# Patient Record
Sex: Male | Born: 1985 | Hispanic: No | Marital: Married | Smoking: Never smoker
Health system: Southern US, Community
[De-identification: ages and names within clinical notes are randomized; demographics above are authoritative.]

## PROBLEM LIST (undated history)

## (undated) DIAGNOSIS — Z86711 Personal history of pulmonary embolism: Secondary | ICD-10-CM

## (undated) DIAGNOSIS — K219 Gastro-esophageal reflux disease without esophagitis: Secondary | ICD-10-CM

## (undated) HISTORY — DX: Personal history of pulmonary embolism: Z86.711

## (undated) HISTORY — DX: Gastro-esophageal reflux disease without esophagitis: K21.9

## (undated) HISTORY — PX: APPENDECTOMY: SHX54

---

## 2018-12-30 ENCOUNTER — Ambulatory Visit (INDEPENDENT_AMBULATORY_CARE_PROVIDER_SITE_OTHER): Payer: 59 | Admitting: Family Medicine

## 2018-12-30 ENCOUNTER — Encounter: Payer: Self-pay | Admitting: Family Medicine

## 2018-12-30 ENCOUNTER — Other Ambulatory Visit: Payer: Self-pay

## 2018-12-30 VITALS — Ht 64.0 in | Wt 155.0 lb

## 2018-12-30 DIAGNOSIS — Z0000 Encounter for general adult medical examination without abnormal findings: Secondary | ICD-10-CM

## 2018-12-30 DIAGNOSIS — K219 Gastro-esophageal reflux disease without esophagitis: Secondary | ICD-10-CM | POA: Insufficient documentation

## 2018-12-30 MED ORDER — PANTOPRAZOLE SODIUM 40 MG PO TBEC: 40.0000 mg | DELAYED_RELEASE_TABLET | Freq: Every day | ORAL | 3 refills | Status: DC

## 2018-12-30 MED FILL — PANTOPRAZOLE SOD DR 40 MG T: 40 | 30 days supply | Qty: 30 | Fill #0

## 2018-12-30 NOTE — Progress Notes (Signed)
Chief Complaint  Patient presents with  . New Patient (Initial Visit)    unable to check bp, pulse       New Patient Visit SUBJECTIVE: HPI: Reginald Dodson is an 33 y.o.male who is being seen for establishing care. Due to COVID-19 pandemic, we are interacting via web portal for an electronic face-to-face visit. I verified patient's ID using 2 identifiers. Patient agreed to proceed with visit via this method. Patient is at home, I am at home. Patient and I are present for visit.   Hx of intermittent GERD. Takes OTC omeprazole prn, usually works well. Has had for nearly 20 years. Last year's labs showed microcytosis w Nml Hb. He would be interested in EGD if low again this year.   No Known Allergies  Past Medical History:  Diagnosis Date  . GERD (gastroesophageal reflux disease)   . History of pulmonary embolus (PE)    Thought to be related to travel   Past Surgical History:  Procedure Laterality Date  . APPENDECTOMY     Family History  Problem Relation Age of Onset  . Cancer Neg Hx   . Clotting disorder Neg Hx    No Known Allergies  Current Outpatient Medications:  .  pantoprazole (PROTONIX) 40 MG tablet, Take 1 tablet (40 mg total) by mouth daily., Disp: 30 tablet, Rfl: 3  ROS GI: Denies current dyspepsia   OBJECTIVE: Ht 5\' 4"  (1.626 m)   Wt 155 lb (70.3 kg)   BMI 26.61 kg/m  No conversational dyspnea Age appropriate judgment and insight Nml affect and mood  ASSESSMENT/PLAN: Well adult exam - Plan: Hemoglobin A1c, CBC, Comprehensive metabolic panel, Lipid panel, TSH, Vitamin D (25 hydroxy), will see in office in 2 d for CPE.   Gastroesophageal reflux disease, esophagitis presence not specified - Plan: PPI prn. Offered referral to GI at any time.   The patient voiced understanding and agreement to the plan.   [place], DO 12/30/18  2:10 PM

## 2018-12-31 ENCOUNTER — Other Ambulatory Visit: Payer: Self-pay

## 2018-12-31 ENCOUNTER — Other Ambulatory Visit (INDEPENDENT_AMBULATORY_CARE_PROVIDER_SITE_OTHER): Payer: 59

## 2018-12-31 DIAGNOSIS — Z0000 Encounter for general adult medical examination without abnormal findings: Secondary | ICD-10-CM | POA: Diagnosis not present

## 2018-12-31 LAB — CBC
HCT: 42.3 % (ref 39.0–52.0)
Hemoglobin: 14.1 g/dL (ref 13.0–17.0)
MCHC: 33.4 g/dL (ref 30.0–36.0)
MCV: 79.5 fl (ref 78.0–100.0)
Platelets: 227 10*3/uL (ref 150.0–400.0)
RBC: 5.32 Mil/uL (ref 4.22–5.81)
RDW: 13.4 % (ref 11.5–15.5)
WBC: 5.9 10*3/uL (ref 4.0–10.5)

## 2018-12-31 LAB — COMPREHENSIVE METABOLIC PANEL
ALT: 37 U/L (ref 0–53)
AST: 24 U/L (ref 0–37)
Albumin: 4.1 g/dL (ref 3.5–5.2)
Alkaline Phosphatase: 45 U/L (ref 39–117)
BUN: 14 mg/dL (ref 6–23)
CO2: 29 mEq/L (ref 19–32)
Calcium: 9.4 mg/dL (ref 8.4–10.5)
Chloride: 102 mEq/L (ref 96–112)
Creatinine, Ser: 0.97 mg/dL (ref 0.40–1.50)
GFR: 88.11 mL/min (ref >60.00)
Glucose, Bld: 87 mg/dL (ref 70–99)
Potassium: 4.3 mEq/L (ref 3.5–5.1)
Sodium: 137 mEq/L (ref 135–145)
Total Bilirubin: 0.6 mg/dL (ref 0.2–1.2)
Total Protein: 6.5 g/dL (ref 6.0–8.3)

## 2018-12-31 LAB — LIPID PANEL
Cholesterol: 138 mg/dL (ref 0–200)
HDL: 28.2 mg/dL — ABNORMAL LOW (ref >39.00)
LDL Cholesterol: 87 mg/dL (ref 0–99)
NonHDL: 109.69
Total CHOL/HDL Ratio: 5
Triglycerides: 115 mg/dL (ref 0.0–149.0)
VLDL: 23 mg/dL (ref 0.0–40.0)

## 2018-12-31 LAB — HEMOGLOBIN A1C: Hgb A1c MFr Bld: 5.8 % (ref 4.6–6.5)

## 2018-12-31 LAB — TSH: TSH: 4.74 u[IU]/mL — ABNORMAL HIGH (ref 0.35–4.50)

## 2018-12-31 LAB — VITAMIN D 25 HYDROXY (VIT D DEFICIENCY, FRACTURES): VITD: 22.41 ng/mL — ABNORMAL LOW (ref 30.00–100.00)

## 2019-01-01 ENCOUNTER — Other Ambulatory Visit (INDEPENDENT_AMBULATORY_CARE_PROVIDER_SITE_OTHER): Payer: 59

## 2019-01-01 DIAGNOSIS — R946 Abnormal results of thyroid function studies: Secondary | ICD-10-CM | POA: Diagnosis not present

## 2019-01-01 LAB — T4, FREE: Free T4: 0.84 ng/dL (ref 0.60–1.60)

## 2019-01-15 ENCOUNTER — Telehealth: Payer: Self-pay | Admitting: Family Medicine

## 2019-01-15 NOTE — Telephone Encounter (Signed)
Called pt to make cpe appt. Left msg

## 2019-02-10 ENCOUNTER — Encounter: Payer: Self-pay | Admitting: Family Medicine

## 2019-02-10 ENCOUNTER — Ambulatory Visit (INDEPENDENT_AMBULATORY_CARE_PROVIDER_SITE_OTHER): Payer: 59 | Admitting: Family Medicine

## 2019-02-10 ENCOUNTER — Other Ambulatory Visit: Payer: Self-pay

## 2019-02-10 VITALS — BP 108/78 | HR 102 | Temp 98.5°F | Ht 64.0 in | Wt 160.2 lb

## 2019-02-10 DIAGNOSIS — Z23 Encounter for immunization: Secondary | ICD-10-CM

## 2019-02-10 DIAGNOSIS — Z0000 Encounter for general adult medical examination without abnormal findings: Secondary | ICD-10-CM | POA: Diagnosis not present

## 2019-02-10 DIAGNOSIS — Z114 Encounter for screening for human immunodeficiency virus [HIV]: Secondary | ICD-10-CM | POA: Diagnosis not present

## 2019-02-10 NOTE — Progress Notes (Signed)
Chief Complaint  Patient presents with  . Annual Exam    Well Male Reginald Dodson is here for a complete physical.   His last physical was >1 year ago.  Current diet: in general, a "healthy" diet.   Current exercise: active in yard, yoga, pushups Weight trend: stable Daytime fatigue? No. Seat belt? Yes.    Health maintenance Tetanus- No HIV- No  Past Medical History:  Diagnosis Date  . GERD (gastroesophageal reflux disease)   . History of pulmonary embolus (PE)    Thought to be related to travel     Past Surgical History:  Procedure Laterality Date  . APPENDECTOMY      Medications  Current Outpatient Medications on File Prior to Visit  Medication Sig Dispense Refill  . cholecalciferol (VITAMIN D3) 25 MCG (1000 UT) tablet Take 1,000 Units by mouth daily.    . Omega-3 Fatty Acids (FISH OIL) 1000 MG CAPS Take by mouth.    . pantoprazole (PROTONIX) 40 MG tablet Take 1 tablet (40 mg total) by mouth daily. 30 tablet 3   Allergies No Known Allergies  Family History Family History  Problem Relation Age of Onset  . Cancer Neg Hx   . Clotting disorder Neg Hx     Review of Systems: Constitutional: no fevers or chills Eye:  no recent significant change in vision Ear/Nose/Mouth/Throat:  Ears:  no tinnitus or hearing loss Nose/Mouth/Throat:  no complaints of nasal congestion, no sore throat Cardiovascular:  no chest pain, no palpitations Respiratory:  no cough and no shortness of breath Gastrointestinal:  no abdominal pain, no change in bowel habits GU:  Male: negative for dysuria, frequency, and incontinence and negative for prostate symptoms Musculoskeletal/Extremities:  no pain, redness, or swelling of the joints Integumentary (Skin/Breast):  no abnormal skin lesions reported Neurologic:  no headaches, no numbness, tingling Endocrine: No unexpected weight changes Hematologic/Lymphatic:  no night sweats  Exam BP 108/78 (BP Location: Left Arm, Patient Position:  Sitting, Cuff Size: Normal)   Pulse (!) 102   Temp 98.5 F (36.9 C) (Oral)   Ht 5\' 4"  (1.626 m)   Wt 160 lb 4 oz (72.7 kg)   SpO2 98%   BMI 27.51 kg/m  General:  well developed, well nourished, in no apparent distress Skin:  no significant moles, warts, or growths Head:  no masses, lesions, or tenderness Eyes:  pupils equal and round, sclera anicteric without injection Ears:  canals without lesions, TMs shiny without retraction, no obvious effusion, no erythema Nose:  nares patent, septum midline, mucosa normal Throat/Pharynx:  lips and gingiva without lesion; tongue and uvula midline; non-inflamed pharynx; no exudates or postnasal drainage Neck: neck supple without adenopathy, thyromegaly, or masses Lungs:  clear to auscultation, breath sounds equal bilaterally, no respiratory distress Cardio:  regular rate and rhythm, no bruits, no LE edema Abdomen:  abdomen soft, nontender; bowel sounds normal; no masses or organomegaly Genital (male): circumcised penis, no lesions or discharge; testes present bilaterally without masses or tenderness Rectal: Deferred Musculoskeletal:  symmetrical muscle groups noted without atrophy or deformity Extremities:  no clubbing, cyanosis, or edema, no deformities, no skin discoloration Neuro:  gait normal; deep tendon reflexes normal and symmetric Psych: well oriented with normal range of affect and appropriate judgment/insight  Assessment and Plan  No diagnosis found.   Well 33 y.o. male. Counseled on diet and exercise. Other orders as above. Follow up in 1 year pending the above workup. The patient voiced understanding and agreement to the plan.  Jilda Rocheicholas Paul AlfordWendling, DO 02/10/19 1:16 PM

## 2019-02-10 NOTE — Addendum Note (Signed)
Addended by: Sharon Seller B on: 02/10/2019 01:28 PM   Modules accepted: Orders

## 2019-02-10 NOTE — Patient Instructions (Addendum)
Keep the diet clean and stay active.  Do monthly self testicular checks in the shower. You are feeling for lumps/bumps that don't belong. If you feel anything like this, let me know!  Let us know if you need anything. 

## 2019-07-16 ENCOUNTER — Emergency Department (HOSPITAL_BASED_OUTPATIENT_CLINIC_OR_DEPARTMENT_OTHER)
Admission: EM | Admit: 2019-07-16 | Discharge: 2019-07-16 | Disposition: A | Discharge status: Home / Self Care | Payer: 59 | Attending: Emergency Medicine | Admitting: Emergency Medicine

## 2019-07-16 ENCOUNTER — Encounter (HOSPITAL_BASED_OUTPATIENT_CLINIC_OR_DEPARTMENT_OTHER): Payer: Self-pay | Admitting: *Deleted

## 2019-07-16 ENCOUNTER — Emergency Department (HOSPITAL_BASED_OUTPATIENT_CLINIC_OR_DEPARTMENT_OTHER): Payer: 59

## 2019-07-16 ENCOUNTER — Other Ambulatory Visit: Payer: Self-pay

## 2019-07-16 DIAGNOSIS — Z86711 Personal history of pulmonary embolism: Secondary | ICD-10-CM | POA: Diagnosis not present

## 2019-07-16 DIAGNOSIS — R0781 Pleurodynia: Secondary | ICD-10-CM | POA: Diagnosis not present

## 2019-07-16 DIAGNOSIS — R0602 Shortness of breath: Secondary | ICD-10-CM | POA: Diagnosis not present

## 2019-07-16 DIAGNOSIS — R079 Chest pain, unspecified: Secondary | ICD-10-CM | POA: Insufficient documentation

## 2019-07-16 LAB — COMPREHENSIVE METABOLIC PANEL
ALT: 35 U/L (ref 0–44)
AST: 26 U/L (ref 15–41)
Albumin: 4.1 g/dL (ref 3.5–5.0)
Alkaline Phosphatase: 48 U/L (ref 38–126)
Anion gap: 8 (ref 5–15)
BUN: 17 mg/dL (ref 6–20)
CO2: 23 mmol/L (ref 22–32)
Calcium: 9.4 mg/dL (ref 8.9–10.3)
Chloride: 105 mmol/L (ref 98–111)
Creatinine, Ser: 0.87 mg/dL (ref 0.61–1.24)
GFR calc Af Amer: >60 mL/min (ref >60)
GFR calc non Af Amer: >60 mL/min (ref >60)
Glucose, Bld: 137 mg/dL — ABNORMAL HIGH (ref 70–99)
Potassium: 3.6 mmol/L (ref 3.5–5.1)
Sodium: 136 mmol/L (ref 135–145)
Total Bilirubin: 0.9 mg/dL (ref 0.3–1.2)
Total Protein: 7.3 g/dL (ref 6.5–8.1)

## 2019-07-16 LAB — CBC WITH DIFFERENTIAL/PLATELET
Abs Immature Granulocytes: 0.02 10*3/uL (ref 0.00–0.07)
Basophils Absolute: 0 10*3/uL (ref 0.0–0.1)
Basophils Relative: 1 %
Eosinophils Absolute: 0.1 10*3/uL (ref 0.0–0.5)
Eosinophils Relative: 1 %
HCT: 43 % (ref 39.0–52.0)
Hemoglobin: 14.5 g/dL (ref 13.0–17.0)
Immature Granulocytes: 0 %
Lymphocytes Relative: 24 %
Lymphs Abs: 1.9 10*3/uL (ref 0.7–4.0)
MCH: 26.9 pg (ref 26.0–34.0)
MCHC: 33.7 g/dL (ref 30.0–36.0)
MCV: 79.8 fL — ABNORMAL LOW (ref 80.0–100.0)
Monocytes Absolute: 0.7 10*3/uL (ref 0.1–1.0)
Monocytes Relative: 9 %
Neutro Abs: 5 10*3/uL (ref 1.7–7.7)
Neutrophils Relative %: 65 %
Platelets: 225 10*3/uL (ref 150–400)
RBC: 5.39 MIL/uL (ref 4.22–5.81)
RDW: 12.7 % (ref 11.5–15.5)
WBC: 7.7 10*3/uL (ref 4.0–10.5)
nRBC: 0 % (ref 0.0–0.2)

## 2019-07-16 LAB — LIPASE, BLOOD: Lipase: 38 U/L (ref 11–51)

## 2019-07-16 MED ORDER — IOHEXOL 350 MG/ML SOLN
100.0000 mL | Freq: Once | INTRAVENOUS | Status: AC | PRN
  Administered 2019-07-16: 100 mL via INTRAVENOUS

## 2019-07-16 NOTE — ED Provider Notes (Signed)
[facility] EMER[facility]ENCY DEPARTMENT Provider Note   CSN: [id number] Arrival date & time: 07/16/19  1054     History Chief Complaint  Patient presents with  . Chest Pain    Reginald Dodson is a 33 y.o. male with a history of right-sided pulmonary embolism in 2016, previously on A/C but no longer, who presents emergency department the right-sided chest pain.  He reports 2 days of symptoms.  He says he feels some discomfort in his right lower rib line.  It is associated with some pleuritic discomfort.  The pain has been persistent.  He feels like his heart rate is also higher than normal.  He is concerned he may have another pulmonary embolism   He denies any history of biliary colic.  He reports nausea, but denies vomiting or other GI symptoms.  He denies any fevers or chills.  He denies any URI symptoms.  He did get the Covid vaccine 3 days ago.  He denies any coronary history.  No history of angina.  Patient works as a Psychologist, educational at [employer].  NKDA  HPI     Past Medical History:  Diagnosis Date  . GERD (gastroesophageal reflux disease)   . History of pulmonary embolus (PE)    Thought to be related to travel    Patient Active Problem List   Diagnosis Date Noted  . Gastroesophageal reflux disease 12/30/2018  . GERD (gastroesophageal reflux disease)     Past Surgical History:  Procedure Laterality Date  . APPENDECTOMY         Family History  Problem Relation Age of Onset  . Cancer Neg Hx   . Clotting disorder Neg Hx     Social History   Tobacco Use  . Smoking status: Never Smoker  . Smokeless tobacco: Never Used  Substance Use Topics  . Alcohol use: Not Currently  . Drug use: Never    Home Medications Prior to Admission medications   Medication Sig Start Date End Date Taking? Authorizing Provider  cholecalciferol (VITAMIN D3) 25 MCG (1000 UT) tablet Take 1,000 Units by mouth daily.   Yes [provider]  Omega-3 Fatty Acids (FISH OIL) 1000  MG CAPS Take by mouth.   Yes [provider]  pantoprazole (PROTONIX) 40 MG tablet Take 1 tablet (40 mg total) by mouth daily. 12/30/18  Yes Shelda Pal, DO    Allergies    Patient has no known allergies.  Review of Systems   Review of Systems  Constitutional: Negative for chills and fever.  Respiratory: Positive for shortness of breath. Negative for cough.   Cardiovascular: Positive for chest pain. Negative for leg swelling.  Gastrointestinal: Positive for nausea. Negative for abdominal pain and vomiting.  Neurological: Negative for syncope.  Psychiatric/Behavioral: Negative for agitation and confusion.  All other systems reviewed and are negative.   Physical Exam Updated Vital Signs BP 113/71   Pulse 88   Temp 98.6 F (37 C) (Oral)   Resp (!) 25   SpO2 100%   Physical Exam Vitals and nursing note reviewed.  Constitutional:      Appearance: He is well-developed.  HENT:     Head: Normocephalic and atraumatic.  Eyes:     Conjunctiva/sclera: Conjunctivae normal.  Cardiovascular:     Rate and Rhythm: Normal rate and regular rhythm.     Heart sounds: No murmur.  Pulmonary:     Effort: Pulmonary effort is normal. No respiratory distress.     Breath sounds: Normal breath sounds.  Abdominal:     Palpations: Abdomen is soft.     Tenderness: There is no abdominal tenderness. There is no guarding or rebound. Negative signs include Murphy's sign.  Musculoskeletal:     Cervical back: Neck supple.  Skin:    General: Skin is warm and dry.  Neurological:     Mental Status: He is alert.  Psychiatric:        Mood and Affect: Mood normal.        Behavior: Behavior normal.     ED Results / Procedures / Treatments   Labs (all labs ordered are listed, but only abnormal results are displayed) Labs Reviewed  COMPREHENSIVE METABOLIC PANEL - Abnormal; Notable for the following components:      Result Value   Glucose, Bld 137 (*)    All other components within  normal limits  CBC WITH DIFFERENTIAL/PLATELET - Abnormal; Notable for the following components:   MCV 79.8 (*)    All other components within normal limits  LIPASE, BLOOD    EKG EKG Interpretation  Date/Time:  Wednesday July 16 2019 11:04:48 EST Ventricular Rate:  96 PR Interval:    QRS Duration: 96 QT Interval:  338 QTC Calculation: 428 R Axis:   -99 Text Interpretation: Sinus rhythm LAD, consider left anterior fascicular block ST elevation, consider inferior injury No STEMI Confirmed by Alvester Chou [id number] on 07/16/2019 11:07:30 AM Also confirmed by Alvester Chou [phone]  on 07/16/2019 12:30:55 PM   Radiology CT Angio Chest PE W and/or Wo Contrast  Result Date: 07/16/2019 CLINICAL DATA:  Shortness of breath and chest pain EXAM: CT ANGIOGRAPHY CHEST WITH CONTRAST TECHNIQUE: Multidetector CT imaging of the chest was performed using the standard protocol during bolus administration of intravenous contrast. Multiplanar CT image reconstructions and MIPs were obtained to evaluate the vascular anatomy. CONTRAST:  OMNIPAQUE IOHEXOL 350 MG/ML SOLN COMPARISON:  CT angiogram chest January 01, 2017; noncontrast enhanced chest CT June 22, 2017 FINDINGS: Cardiovascular: There is no demonstrable pulmonary embolus. There is no thoracic aortic aneurysm or dissection. Visualized great vessels appear unremarkable. There is no appreciable pericardial effusion or pericardial thickening. Mediastinum/Nodes: Visualized thyroid appears unremarkable. There is no evident thoracic adenopathy. There is a small hiatal hernia. Lungs/Pleura: There is no appreciable edema or consolidation. No pleural effusions are evident. A previously noted 6 mm nodular opacity on the right in the right middle lobe region along the right minor fissure is stable, currently seen on axial slice 41 series 6. Previously noted 3 mm nodular opacity in the right upper lobe is not appreciable currently. No new pulmonary nodular  lesions are evident. Upper Abdomen: Visualized upper abdominal structures appear unremarkable. Musculoskeletal: No blastic or lytic bone lesions. No evident chest wall lesions. Review of the MIP images confirms the above findings. IMPRESSION: 1. No demonstrable pulmonary embolus. No thoracic aortic aneurysm or dissection. 2. No edema or consolidation. No pleural effusion. Stable 6 mm nodular opacity in the right middle lobe region, a potential perifissural lymph node. No new pulmonary nodular opacities evident. 3.  No evident adenopathy. 4.  Small hiatal hernia. Electronically Signed   By: Bretta Bang III M.D.   On: 07/16/2019 12:23    Procedures Procedures (including critical care time)  Medications Ordered in ED Medications  iohexol (OMNIPAQUE) 350 MG/ML injection 100 mL (100 mLs Intravenous Contrast Given 07/16/19 1213)    ED Course  I have reviewed the triage vital signs and the nursing notes.  Pertinent labs & imaging results that were  available during my care of the patient were reviewed by me and considered in my medical decision making (see chart for details).   33 yo male w/ hx of PE four years ago, no longer on A/C, presenting with pleuritic right sided chest pain for 2 days.  He expresses concerns for recurrent PE.  He is mildly tachycardic here and states that is unusual for him.  It's not clear whether his prior PE was provoked.  He tells me he was flying a lot at the time, but his DVT studies and coagulopathy panels were negative.    He also reports he had a normal DDimer during his prior PE.  Given this fact, I think it's reasonable to proceed directly to CT PE.  The patient is in agreement.  Otherwise he has a benign abdominal exam.  Labs are pending  Low suspicion for PNA, ACS, Aortic dissection, or PTX based on this presentation    This note was dictated using dragon dictation software.  Please be aware that there may be minor translation errors as a result of this  oral dictation   Final Clinical Impression(s) / ED Diagnoses Final diagnoses:  Right-sided chest pain    Rx / DC Orders ED Discharge Orders    None       Varonica Siharath, Kermit BaloMatthew J, MD 07/16/19 1904

## 2019-07-16 NOTE — ED Triage Notes (Signed)
Patient stated that he has a chest pain  (pointing to his RUQ) 2 days ago.

## 2019-08-19 DIAGNOSIS — Z1159 Encounter for screening for other viral diseases: Secondary | ICD-10-CM | POA: Diagnosis not present

## 2019-08-22 DIAGNOSIS — K2930 Chronic superficial gastritis without bleeding: Secondary | ICD-10-CM | POA: Diagnosis not present

## 2019-08-22 DIAGNOSIS — K3189 Other diseases of stomach and duodenum: Secondary | ICD-10-CM | POA: Diagnosis not present

## 2019-08-22 DIAGNOSIS — R12 Heartburn: Secondary | ICD-10-CM | POA: Diagnosis not present

## 2019-08-22 DIAGNOSIS — B9681 Helicobacter pylori [H. pylori] as the cause of diseases classified elsewhere: Secondary | ICD-10-CM | POA: Diagnosis not present

## 2019-08-22 DIAGNOSIS — K449 Diaphragmatic hernia without obstruction or gangrene: Secondary | ICD-10-CM | POA: Diagnosis not present

## 2019-08-22 DIAGNOSIS — K222 Esophageal obstruction: Secondary | ICD-10-CM | POA: Diagnosis not present

## 2019-08-27 MED FILL — AMOXICILLIN 500 MG CAPSULE: 500 | 10 days supply | Qty: 40 | Fill #0

## 2019-08-27 MED FILL — CLARITHROMYCIN 500 MG TAB: 500 | 10 days supply | Qty: 20 | Fill #0

## 2019-08-27 MED FILL — PANTOPRAZOLE SOD DR 40 MG T: 40 | 10 days supply | Qty: 20 | Fill #0

## 2019-10-13 DIAGNOSIS — Z7689 Persons encountering health services in other specified circumstances: Secondary | ICD-10-CM | POA: Diagnosis not present

## 2019-10-13 DIAGNOSIS — B9681 Helicobacter pylori [H. pylori] as the cause of diseases classified elsewhere: Secondary | ICD-10-CM | POA: Diagnosis not present

## 2019-11-03 DIAGNOSIS — H52221 Regular astigmatism, right eye: Secondary | ICD-10-CM | POA: Diagnosis not present

## 2019-11-03 DIAGNOSIS — H5212 Myopia, left eye: Secondary | ICD-10-CM | POA: Diagnosis not present

## 2019-11-03 DIAGNOSIS — H5211 Myopia, right eye: Secondary | ICD-10-CM | POA: Diagnosis not present

## 2020-01-13 ENCOUNTER — Encounter: Payer: Self-pay | Admitting: Family Medicine

## 2020-01-13 ENCOUNTER — Ambulatory Visit (INDEPENDENT_AMBULATORY_CARE_PROVIDER_SITE_OTHER): Payer: 59 | Admitting: Family Medicine

## 2020-01-13 ENCOUNTER — Other Ambulatory Visit: Payer: Self-pay

## 2020-01-13 VITALS — BP 104/68 | HR 78 | Temp 96.1°F | Ht 65.0 in | Wt 152.1 lb

## 2020-01-13 DIAGNOSIS — Z1159 Encounter for screening for other viral diseases: Secondary | ICD-10-CM | POA: Diagnosis not present

## 2020-01-13 DIAGNOSIS — Z114 Encounter for screening for human immunodeficiency virus [HIV]: Secondary | ICD-10-CM | POA: Diagnosis not present

## 2020-01-13 DIAGNOSIS — Z0000 Encounter for general adult medical examination without abnormal findings: Secondary | ICD-10-CM | POA: Diagnosis not present

## 2020-01-13 NOTE — Progress Notes (Signed)
Chief Complaint  Patient presents with  . Annual Exam    Well Male Reginald Dodson is here for a complete physical.   His last physical was >1 year ago.  Current diet: in general, a "healthy" diet.   Current exercise: gardening, walking Weight trend:  Does pt snore? No. Daytime fatigue? No. Seat belt? Yes.    Health maintenance Tetanus- Yes HIV- No  Past Medical History:  Diagnosis Date  . GERD (gastroesophageal reflux disease)   . History of pulmonary embolus (PE)    Thought to be related to travel     Past Surgical History:  Procedure Laterality Date  . APPENDECTOMY      Medications  Current Outpatient Medications on File Prior to Visit  Medication Sig Dispense Refill  . cholecalciferol (VITAMIN D3) 25 MCG (1000 UT) tablet Take 1,000 Units by mouth daily.    . Omega-3 Fatty Acids (FISH OIL) 1000 MG CAPS Take by mouth.    . pantoprazole (PROTONIX) 40 MG tablet Take 1 tablet (40 mg total) by mouth daily. 30 tablet 3   No current facility-administered medications on file prior to visit.     Allergies No Known Allergies  Family History Family History  Problem Relation Age of Onset  . Cancer Neg Hx   . Clotting disorder Neg Hx     Review of Systems: Constitutional: no fevers or chills Eye:  no recent significant change in vision Ear/Nose/Mouth/Throat:  Ears:  no hearing loss Nose/Mouth/Throat:  no complaints of nasal congestion, no sore throat Cardiovascular:  no chest pain Respiratory:  no shortness of breath Gastrointestinal:  no abdominal pain, no change in bowel habits GU:  Male: negative for dysuria, frequency, and incontinence Musculoskeletal/Extremities:  no pain of the joints Integumentary (Skin/Breast):  no abnormal skin lesions reported Neurologic:  no headaches Endocrine: No unexpected weight changes Hematologic/Lymphatic:  no night sweats  Exam BP 104/68 (BP Location: Right Arm, Patient Position: Sitting, Cuff Size: Normal)   Pulse 78   Temp  (!) 96.1 F (35.6 C) (Temporal)   Ht 5\' 5"  (1.651 m)   Wt 152 lb 2 oz (69 kg)   SpO2 98%   BMI 25.31 kg/m  General:  well developed, well nourished, in no apparent distress Skin:  no significant moles, warts, or growths Head:  no masses, lesions, or tenderness Eyes:  pupils equal and round, sclera anicteric without injection Ears:  canals without lesions, TMs shiny without retraction, no obvious effusion, no erythema Nose:  nares patent, septum midline, mucosa normal Throat/Pharynx:  lips and gingiva without lesion; tongue and uvula midline; non-inflamed pharynx; no exudates or postnasal drainage Neck: neck supple without adenopathy, thyromegaly, or masses Lungs:  clear to auscultation, breath sounds equal bilaterally, no respiratory distress Cardio:  regular rate and rhythm, no bruits, no LE edema Abdomen:  abdomen soft, nontender; bowel sounds normal; no masses or organomegaly Rectal: Deferred Musculoskeletal:  symmetrical muscle groups noted without atrophy or deformity Extremities:  no clubbing, cyanosis, or edema, no deformities, no skin discoloration Neuro:  gait normal; deep tendon reflexes normal and symmetric Psych: well oriented with normal range of affect and appropriate judgment/insight  Assessment and Plan  Well adult exam - Plan: CBC, Comprehensive metabolic panel, Lipid panel, Hemoglobin A1c, VITAMIN D 25 Hydroxy (Vit-D Deficiency, Fractures), TSH, T4, free  Encounter for hepatitis C screening test for low risk patient - Plan: Hepatitis C antibody  Screening for HIV (human immunodeficiency virus) - Plan: HIV Antibody (routine testing w rflx)  Well 34 y.o. male. Counseled on diet and exercise. Self testicular exams recommended at least monthly.  Other orders as above. Follow up in 1 year pending the above workup. The patient voiced understanding and agreement to the plan.  Jilda Roche [place], DO 01/13/20 1:19 PM

## 2020-01-13 NOTE — Patient Instructions (Signed)
Give us 2-3 business days to get the results of your labs back.   Keep the diet clean and stay active.  Do monthly self testicular checks in the shower. You are feeling for lumps/bumps that don't belong. If you feel anything like this, let me know!  Let us know if you need anything. 

## 2020-01-16 ENCOUNTER — Telehealth: Payer: Self-pay | Admitting: Family Medicine

## 2020-01-16 NOTE — Telephone Encounter (Signed)
Patient requested a lab appointment on 01/21/2020 at 945am . Patient made aware that we do not have anything available on that day. Patient offered to go [facility] or [facility].  Patient would like to go to [facility]. Orders just need to be printed, patient will pick up on 01/21/2020. 

## 2020-01-16 NOTE — Addendum Note (Signed)
Addended by: Scharlene Gloss B on: 01/16/2020 02:44 PM   Modules accepted: Orders

## 2020-01-16 NOTE — Telephone Encounter (Signed)
Printed orders/put in enveloped and at the front desk for pickup. The patient is aware

## 2020-01-16 NOTE — Addendum Note (Signed)
Addended by: Thelma Barge D on: 01/16/2020 02:53 PM   Modules accepted: Orders

## 2020-01-21 DIAGNOSIS — Z1159 Encounter for screening for other viral diseases: Secondary | ICD-10-CM | POA: Diagnosis not present

## 2020-01-21 DIAGNOSIS — Z0000 Encounter for general adult medical examination without abnormal findings: Secondary | ICD-10-CM | POA: Diagnosis not present

## 2020-01-21 DIAGNOSIS — Z114 Encounter for screening for human immunodeficiency virus [HIV]: Secondary | ICD-10-CM | POA: Diagnosis not present

## 2020-01-22 LAB — COMPREHENSIVE METABOLIC PANEL
ALT: 30 IU/L (ref 0–44)
AST: 22 IU/L (ref 0–40)
Albumin/Globulin Ratio: 1.7 (ref 1.2–2.2)
Albumin: 4.6 g/dL (ref 4.0–5.0)
Alkaline Phosphatase: 62 IU/L (ref 48–121)
BUN/Creatinine Ratio: 14 (ref 9–20)
BUN: 14 mg/dL (ref 6–20)
Bilirubin Total: 0.6 mg/dL (ref 0.0–1.2)
CO2: 23 mmol/L (ref 20–29)
Calcium: 9.9 mg/dL (ref 8.7–10.2)
Chloride: 100 mmol/L (ref 96–106)
Creatinine, Ser: 0.98 mg/dL (ref 0.76–1.27)
GFR calc Af Amer: 115 mL/min/{1.73_m2} (ref >59)
GFR calc non Af Amer: 99 mL/min/{1.73_m2} (ref >59)
Globulin, Total: 2.7 g/dL (ref 1.5–4.5)
Glucose: 83 mg/dL (ref 65–99)
Potassium: 4.2 mmol/L (ref 3.5–5.2)
Sodium: 138 mmol/L (ref 134–144)
Total Protein: 7.3 g/dL (ref 6.0–8.5)

## 2020-01-22 LAB — VITAMIN D 25 HYDROXY (VIT D DEFICIENCY, FRACTURES): Vit D, 25-Hydroxy: 34.5 ng/mL (ref 30.0–100.0)

## 2020-01-22 LAB — HEMOGLOBIN A1C
Est. average glucose Bld gHb Est-mCnc: 114 mg/dL
Hgb A1c MFr Bld: 5.6 % (ref 4.8–5.6)

## 2020-01-22 LAB — LIPID PANEL
Chol/HDL Ratio: 4.4 ratio (ref 0.0–5.0)
Cholesterol, Total: 164 mg/dL (ref 100–199)
HDL: 37 mg/dL — ABNORMAL LOW (ref >39)
LDL Chol Calc (NIH): 112 mg/dL — ABNORMAL HIGH (ref 0–99)
Triglycerides: 79 mg/dL (ref 0–149)
VLDL Cholesterol Cal: 15 mg/dL (ref 5–40)

## 2020-01-22 LAB — CBC
Hematocrit: 43.5 % (ref 37.5–51.0)
Hemoglobin: 14.4 g/dL (ref 13.0–17.7)
MCH: 26.5 pg — ABNORMAL LOW (ref 26.6–33.0)
MCHC: 33.1 g/dL (ref 31.5–35.7)
MCV: 80 fL (ref 79–97)
Platelets: 212 10*3/uL (ref 150–450)
RBC: 5.43 x10E6/uL (ref 4.14–5.80)
RDW: 13.3 % (ref 11.6–15.4)
WBC: 6.3 10*3/uL (ref 3.4–10.8)

## 2020-01-22 LAB — HIV ANTIBODY (ROUTINE TESTING W REFLEX): HIV Screen 4th Generation wRfx: NONREACTIVE

## 2020-01-22 LAB — TSH: TSH: 2.95 u[IU]/mL (ref 0.450–4.500)

## 2020-01-22 LAB — T4, FREE: Free T4: 1.27 ng/dL (ref 0.82–1.77)

## 2020-01-22 LAB — HEPATITIS C ANTIBODY: Hep C Virus Ab: <0.1 s/co ratio (ref 0.0–0.9)

## 2020-01-23 ENCOUNTER — Emergency Department (INDEPENDENT_AMBULATORY_CARE_PROVIDER_SITE_OTHER)
Admission: EM | Admit: 2020-01-23 | Discharge: 2020-01-23 | Disposition: A | Discharge status: Home / Self Care | Payer: 59 | Source: Home / Self Care | Attending: Family Medicine | Admitting: Family Medicine

## 2020-01-23 DIAGNOSIS — R509 Fever, unspecified: Secondary | ICD-10-CM

## 2020-01-23 LAB — POCT RAPID STREP A (OFFICE): Rapid Strep A Screen: NEGATIVE

## 2020-01-23 NOTE — Discharge Instructions (Addendum)
If cold-like symptoms develop, try the following: Take plain guaifenesin (1200mg  extended release tabs such as Mucinex) twice daily, with plenty of water, for cough and congestion.  May add Pseudoephedrine (30mg , one or two every 4 to 6 hours) for sinus congestion.  Get adequate rest.   May use Afrin nasal spray (or generic oxymetazoline) each morning for about 5 days and then discontinue.  Also recommend using saline nasal spray several times daily and saline nasal irrigation (AYR is a common brand).  Use Flonase nasal spray each morning after using Afrin nasal spray and saline nasal irrigation. Try warm salt water gargles for sore throat.  Stop all antihistamines for now, and other non-prescription cough/cold preparations. May take Ibuprofen 200mg , 4 tabs every 8 hours with food for sore throat, body aches, etc. May take Delsym Cough Suppressant at bedtime for nighttime cough.

## 2020-01-23 NOTE — ED Triage Notes (Signed)
Patient presents to Urgent Care with complaints of fever of 100.7 since two days ago. Patient reports he took tylenol and htat helped but the fever returned.  Pt also has a sore throat and headache today as well.

## 2020-01-23 NOTE — ED Provider Notes (Signed)
[facility]    CSN: [id number] Arrival date & time: 01/23/20  1017      History   Chief Complaint Chief Complaint  Patient presents with  . Fever    HPI Reginald Dodson is a 34 y.o. male.   Two days ago patient developed fatigue, malaise, and chills.  Yesterday he developed fever to 100.7.  He has felt somewhat light-headed and today has developed sore throat and headache.  He denies nasal congestion, cough, GI and GU symptoms.      Past Medical History:  Diagnosis Date  . GERD (gastroesophageal reflux disease)   . History of pulmonary embolus (PE)    Thought to be related to travel    Patient Active Problem List   Diagnosis Date Noted  . Gastroesophageal reflux disease 12/30/2018  . GERD (gastroesophageal reflux disease)     Past Surgical History:  Procedure Laterality Date  . APPENDECTOMY         Home Medications    Prior to Admission medications   Medication Sig Start Date End Date Taking? Authorizing Provider  pantoprazole (PROTONIX) 40 MG tablet Take 1 tablet (40 mg total) by mouth daily. 12/30/18  Yes Sharlene Dory, DO  cholecalciferol (VITAMIN D3) 25 MCG (1000 UT) tablet Take 1,000 Units by mouth daily.    [provider]  Omega-3 Fatty Acids (FISH OIL) 1000 MG CAPS Take by mouth.    [provider]    Family History Family History  Problem Relation Age of Onset  . Healthy Mother   . Healthy Father   . Cancer Neg Hx   . Clotting disorder Neg Hx     Social History Social History   Tobacco Use  . Smoking status: Never Smoker  . Smokeless tobacco: Never Used  Substance Use Topics  . Alcohol use: Yes    Comment: rare  . Drug use: Never     Allergies   Patient has no known allergies.   Review of Systems Review of Systems + sore throat No cough No pleuritic pain No wheezing No nasal congestion No post-nasal drainage No sinus pain/pressure No itchy/red eyes No earache No hemoptysis No  SOB + fever, + chills No nausea No vomiting No abdominal pain No diarrhea No urinary symptoms No skin rash + fatigue + malaise + myalgias + headache Used OTC meds without relief   Physical Exam Triage Vital Signs ED Triage Vitals  Enc Vitals Group     BP 01/23/20 1033 116/76     Pulse Rate 01/23/20 1033 97     Resp 01/23/20 1033 16     Temp 01/23/20 1033 99.6 F (37.6 C)     Temp Source 01/23/20 1033 Oral     SpO2 01/23/20 1033 99 %     Weight --      Height --      Head Circumference --      Peak Flow --      Pain Score 01/23/20 1032 0     Pain Loc --      Pain Edu? --      Excl. in GC? --    No data found.  Updated Vital Signs BP 116/76 (BP Location: Right Arm)   Pulse 97   Temp 99.6 F (37.6 C) (Oral)   Resp 16   SpO2 99%   Visual Acuity Right Eye Distance:   Left Eye Distance:   Bilateral Distance:    Right Eye Near:   Left Eye  Near:    Bilateral Near:     Physical Exam Nursing notes and Vital Signs reviewed. Appearance:  Patient appears stated age, and in no acute distress Eyes:  Pupils are equal, round, and reactive to light and accomodation.  Extraocular movement is intact.  Conjunctivae are not inflamed  Ears:  Canals normal.  Tympanic membranes normal.  Nose:  Normal turbinates.  No sinus tenderness.    Pharynx:  Minimal erythema. Neck:  Supple.  Mildly enlarged lateral nodes are present, tender to palpation on the left.   Lungs:  Clear to auscultation.  Breath sounds are equal.  Moving air well. Heart:  Regular rate and rhythm without murmurs, rubs, or gallops.  Abdomen:  Nontender without masses or hepatosplenomegaly.  Bowel sounds are present.  No CVA or flank tenderness.  Extremities:  No edema.  Skin:  No rash present.   UC Treatments / Results  Labs (all labs ordered are listed, but only abnormal results are displayed) Labs Reviewed  SARS-COV-2 RNA,(COVID-19) QUALITATIVE NAAT  STREP A DNA PROBE  POCT RAPID STREP A (OFFICE)  negative    EKG   Radiology No results found.  Procedures Procedures (including critical care time)  Medications Ordered in UC Medications - No data to display  Initial Impression / Assessment and Plan / UC Course  I have reviewed the triage vital signs and the nursing notes.  Pertinent labs & imaging results that were available during my care of the patient were reviewed by me and considered in my medical decision making (see chart for details).    Benign exam; suspect viral URI. Treat symptomatically for now. COVID19 send out test. Followup with Family Doctor if not improved in about 10 days.  Final Clinical Impressions(s) / UC Diagnoses   Final diagnoses:  Fever, unspecified     Discharge Instructions     If cold-like symptoms develop, try the following: Take plain guaifenesin (1200mg  extended release tabs such as Mucinex) twice daily, with plenty of water, for cough and congestion.  May add Pseudoephedrine (30mg , one or two every 4 to 6 hours) for sinus congestion.  Get adequate rest.   May use Afrin nasal spray (or generic oxymetazoline) each morning for about 5 days and then discontinue.  Also recommend using saline nasal spray several times daily and saline nasal irrigation (AYR is a common brand).  Use Flonase nasal spray each morning after using Afrin nasal spray and saline nasal irrigation. Try warm salt water gargles for sore throat.  Stop all antihistamines for now, and other non-prescription cough/cold preparations. May take Ibuprofen 200mg , 4 tabs every 8 hours with food for sore throat, body aches, etc. May take Delsym Cough Suppressant at bedtime for nighttime cough.     ED Prescriptions    None        , MD 01/24/20 2303

## 2020-01-24 LAB — STREP A DNA PROBE: Group A Strep Probe: NOT DETECTED

## 2020-01-24 LAB — SARS-COV-2 RNA,(COVID-19) QUALITATIVE NAAT: SARS CoV2 RNA: NOT DETECTED

## 2020-01-27 ENCOUNTER — Other Ambulatory Visit: Payer: Self-pay | Admitting: Family Medicine

## 2020-01-27 MED ORDER — LIDOCAINE VISCOUS HCL 2 % MT SOLN: 5.0000 mL | Freq: Four times a day (QID) | OROMUCOSAL | 0 refills | Status: DC | PRN

## 2020-01-27 MED ORDER — AMOXICILLIN-POT CLAVULANATE 875-125 MG PO TABS: 1.0000 | ORAL_TABLET | Freq: Two times a day (BID) | ORAL | 0 refills | Status: AC

## 2020-04-13 ENCOUNTER — Other Ambulatory Visit: Payer: Self-pay | Admitting: Family Medicine

## 2020-04-13 MED ORDER — BENZONATATE 200 MG PO CAPS
200.0000 mg | ORAL_CAPSULE | Freq: Three times a day (TID) | ORAL | 0 refills | Status: DC | PRN
Start: 2020-04-13 — End: 2021-01-21

## 2020-04-13 MED FILL — BENZONATATE 200 MG CAPS: 200 | 10 days supply | Qty: 30 | Fill #0

## 2020-10-19 ENCOUNTER — Other Ambulatory Visit (HOSPITAL_COMMUNITY): Payer: Self-pay | Admitting: *Deleted

## 2020-10-21 ENCOUNTER — Other Ambulatory Visit: Payer: Self-pay

## 2020-10-21 ENCOUNTER — Ambulatory Visit (HOSPITAL_BASED_OUTPATIENT_CLINIC_OR_DEPARTMENT_OTHER)
Admission: RE | Admit: 2020-10-21 | Discharge: 2020-10-21 | Disposition: A | Discharge status: Home / Self Care | Payer: 59 | Source: Ambulatory Visit | Attending: Cardiology | Admitting: Cardiology

## 2020-11-26 ENCOUNTER — Other Ambulatory Visit: Payer: Self-pay | Admitting: Family Medicine

## 2020-11-26 ENCOUNTER — Other Ambulatory Visit (HOSPITAL_COMMUNITY): Payer: Self-pay

## 2020-11-26 MED ORDER — RIVAROXABAN 20 MG PO TABS
ORAL_TABLET | ORAL | 0 refills | Status: DC
  Filled 2020-11-26: qty 10, 10d supply, fill #0

## 2020-11-26 MED ORDER — RIVAROXABAN 10 MG PO TABS
ORAL_TABLET | ORAL | 0 refills | Status: DC
Start: 2020-11-26 — End: 2021-01-21
  Filled 2020-11-26: qty 10, 10d supply, fill #0

## 2020-11-26 MED ORDER — PANTOPRAZOLE SODIUM 40 MG PO TBEC
40.0000 mg | DELAYED_RELEASE_TABLET | Freq: Every day | ORAL | 3 refills | Status: DC
  Filled 2020-11-26: qty 30, 30d supply, fill #0

## 2020-11-30 ENCOUNTER — Other Ambulatory Visit (HOSPITAL_COMMUNITY): Payer: Self-pay

## 2021-01-21 ENCOUNTER — Other Ambulatory Visit: Payer: Self-pay

## 2021-01-21 ENCOUNTER — Ambulatory Visit (INDEPENDENT_AMBULATORY_CARE_PROVIDER_SITE_OTHER): Payer: 59 | Admitting: Family Medicine

## 2021-01-21 ENCOUNTER — Encounter: Payer: Self-pay | Admitting: Family Medicine

## 2021-01-21 VITALS — BP 112/68 | HR 91 | Temp 98.1°F | Ht 64.0 in | Wt 153.0 lb

## 2021-01-21 DIAGNOSIS — Z0000 Encounter for general adult medical examination without abnormal findings: Secondary | ICD-10-CM | POA: Diagnosis not present

## 2021-01-21 NOTE — Progress Notes (Signed)
CC: well visit  Well Male Reginald Dodson is here for a complete physical.   His last physical was >1 year ago.  Current diet: in general, a "good" diet.   Current exercise: Running/walking Weight trend: stable Fatigue out of ordinary? No. Seat belt? Yes.    Health maintenance Tetanus- Yes HIV- Yes Hep C- Yes  Past Medical History:  Diagnosis Date   GERD (gastroesophageal reflux disease)    History of pulmonary embolus (PE)    Thought to be related to travel     Past Surgical History:  Procedure Laterality Date   APPENDECTOMY      Medications  Current Outpatient Medications on File Prior to Visit  Medication Sig Dispense Refill   benzonatate (TESSALON) 200 MG capsule Take 1 capsule (200 mg total) by mouth 3 (three) times daily as needed for cough. 30 capsule 0   cholecalciferol (VITAMIN D3) 25 MCG (1000 UT) tablet Take 1,000 Units by mouth daily.     lidocaine (XYLOCAINE) 2 % solution Use as directed 5-10 mLs in the mouth or throat every 6 (six) hours as needed for mouth pain. Do not swallow. 200 mL 0   Omega-3 Fatty Acids (FISH OIL) 1000 MG CAPS Take by mouth.     pantoprazole (PROTONIX) 40 MG tablet Take 1 tablet (40 mg total) by mouth daily. 30 tablet 3   rivaroxaban (XARELTO) 10 MG TABS tablet Take 1 tablet by mouth daily. start 5 days prior to flight as directed. 10 tablet 0   Allergies No Known Allergies  Family History Family History  Problem Relation Age of Onset   Healthy Mother    Healthy Father    Cancer Neg Hx    Clotting disorder Neg Hx     Review of Systems: Constitutional: no fevers or chills Eye:  no recent significant change in vision Ear/Nose/Mouth/Throat:  Ears:  no hearing loss Nose/Mouth/Throat:  no complaints of nasal congestion, no sore throat Cardiovascular:  no chest pain Respiratory:  no shortness of breath Gastrointestinal:  no abdominal pain, no change in bowel habits GU:  Male: negative for dysuria Musculoskeletal/Extremities:  no  pain of the joints Integumentary (Skin/Breast):  no abnormal skin lesions reported Neurologic:  no headaches Endocrine: No unexpected weight changes Hematologic/Lymphatic:  no night sweats  Exam BP 112/68   Pulse 91   Temp 98.1 F (36.7 C) (Oral)   Ht 5\' 4"  (1.626 m)   Wt 153 lb (69.4 kg)   SpO2 97%   BMI 26.26 kg/m  General:  well developed, well nourished, in no apparent distress Skin:  no significant moles, warts, or growths Head:  no masses, lesions, or tenderness Eyes:  pupils equal and round, sclera anicteric without injection Ears:  canals without lesions, TMs shiny without retraction, no obvious effusion, no erythema Nose:  nares patent, septum midline, mucosa normal Throat/Pharynx:  lips and gingiva without lesion; tongue and uvula midline; non-inflamed pharynx; no exudates or postnasal drainage Neck: neck supple without adenopathy, thyromegaly, or masses Lungs:  clear to auscultation, breath sounds equal bilaterally, no respiratory distress Cardio:  regular rate and rhythm, no bruits, no LE edema Abdomen:  abdomen soft, nontender; bowel sounds normal; no masses or organomegaly Genital (male): Deferred Rectal: Deferred Musculoskeletal:  symmetrical muscle groups noted without atrophy or deformity Extremities:  no clubbing, cyanosis, or edema, no deformities, no skin discoloration Neuro:  gait normal; deep tendon reflexes normal and symmetric Psych: well oriented with normal range of affect and appropriate judgment/insight  Assessment and  Plan  Well adult exam   Well 35 y.o. male. Counseled on diet and exercise. Self testicular exams recommended at least monthly.  Had labs 2 mo ago.  Follow up in 1 year or prn. The patient voiced understanding and agreement to the plan.  Jilda Roche [place], DO 01/21/21 1:32 PM

## 2021-01-21 NOTE — Patient Instructions (Addendum)
Keep the diet clean and stay active.  Do monthly self testicular checks in the shower. You are feeling for lumps/bumps that don't belong. If you feel anything like this, let me know!  Let us know if you need anything. 

## 2021-04-19 DIAGNOSIS — H5213 Myopia, bilateral: Secondary | ICD-10-CM | POA: Diagnosis not present

## 2021-08-03 IMAGING — CT CT ANGIO CHEST
2 of 9 series · 18 of 36 positions shown · IV contrast (Omnipaque)
Comparison: CT angiogram chest January 01, 2017; noncontrast enhanced
chest CT June 22, 2017

CLINICAL DATA: Shortness of breath and chest pain

EXAM:
CT ANGIOGRAPHY CHEST WITH CONTRAST
TECHNIQUE: Multidetector CT imaging of the chest was performed using the
standard protocol during bolus administration of intravenous
contrast. Multiplanar CT image reconstructions and MIPs were
obtained to evaluate the vascular anatomy.
CONTRAST:  100mL OMNIPAQUE IOHEXOL 350 MG/ML SOLN

[Series 7: pe coronal mpr · coronal · 0.45mm/px · 1 of 123 slices shown]
[im 62/123  mediastinal]
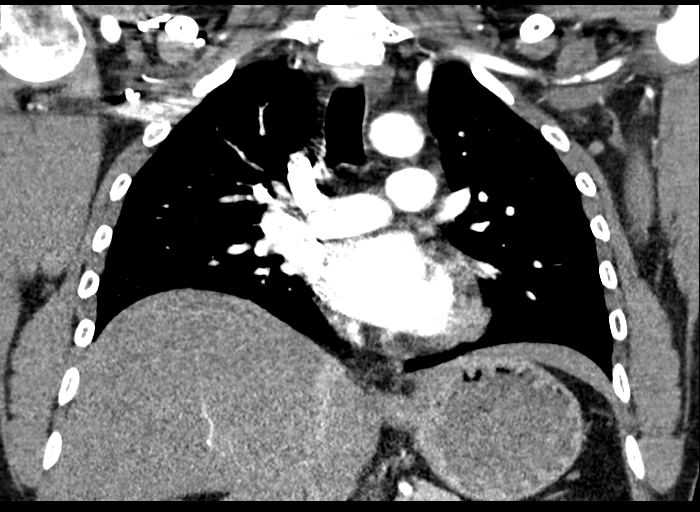

[Series 11: pe thins · axial · 0.65mm/px · z∈[-246,-46]mm · 17 of 224 slices shown]
[im 12/224  lung]
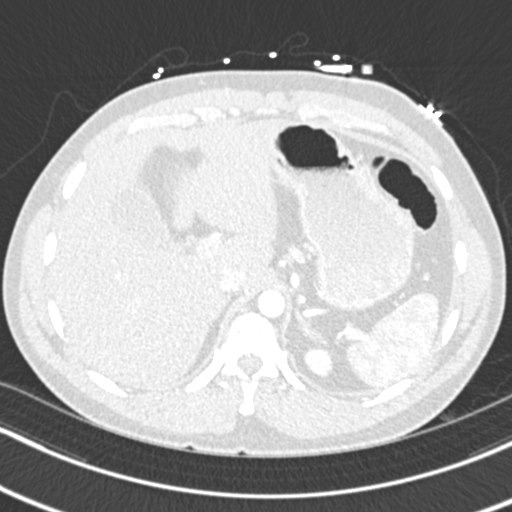
[im 24/224  mediastinal]
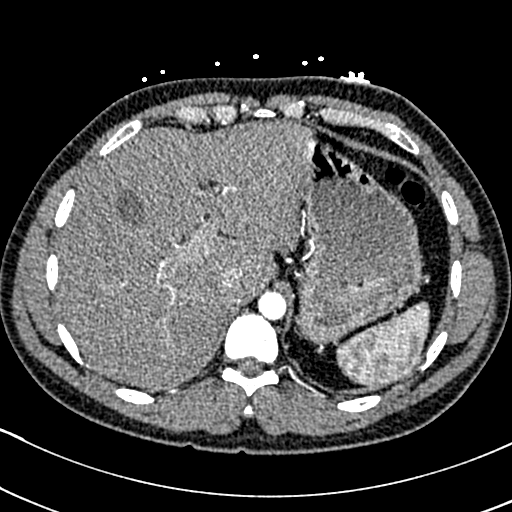
[im 36/224  lung]
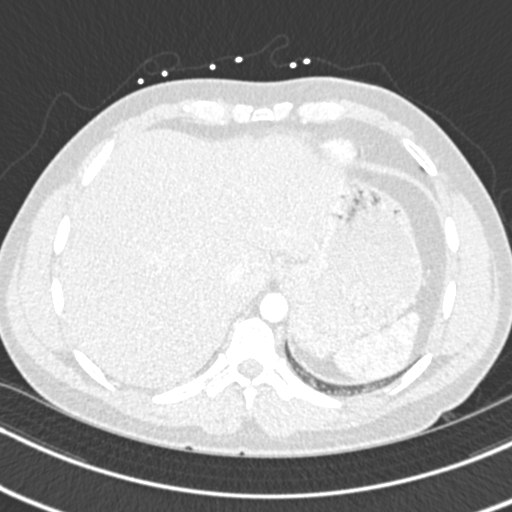
[im 47/224  mediastinal]
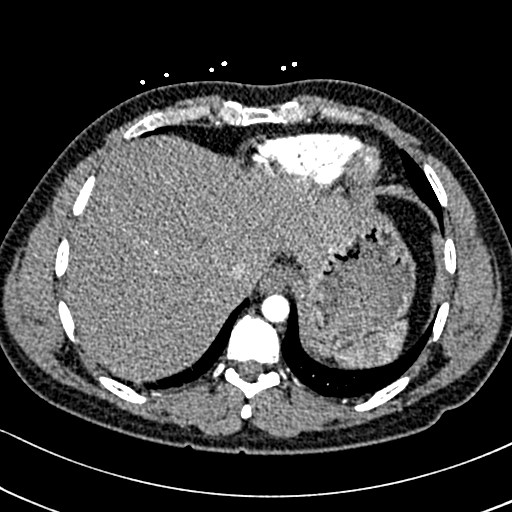
[im 59/224  lung]
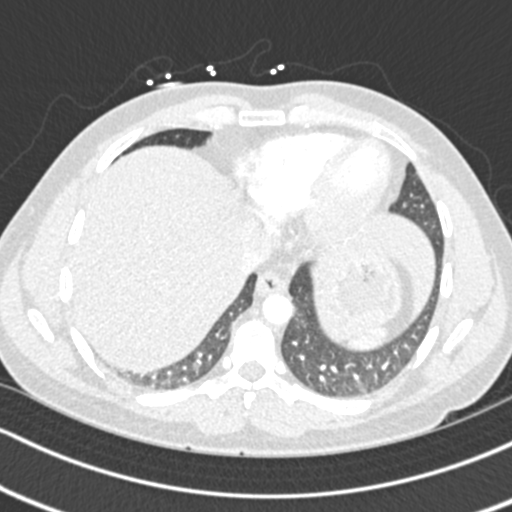
[im 71/224  mediastinal]
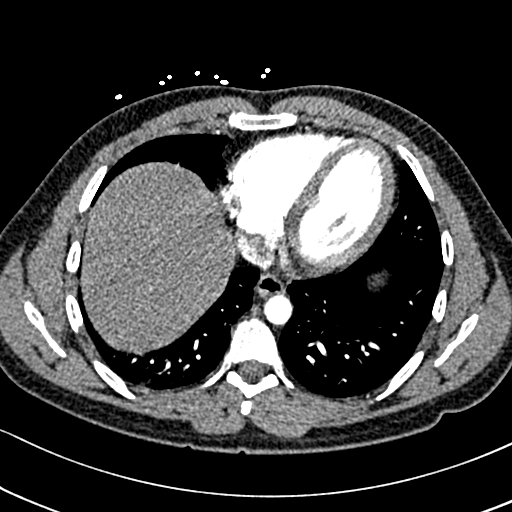
[im 83/224  lung]
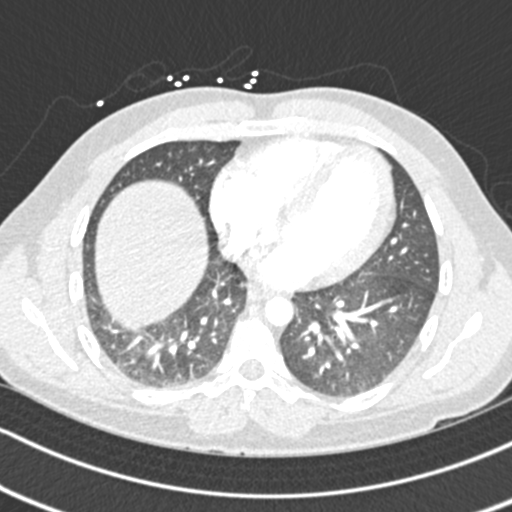
[im 94/224  mediastinal]
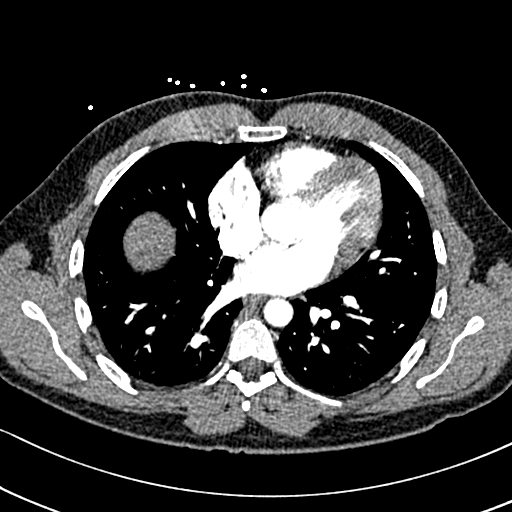
[im 118/224  lung]
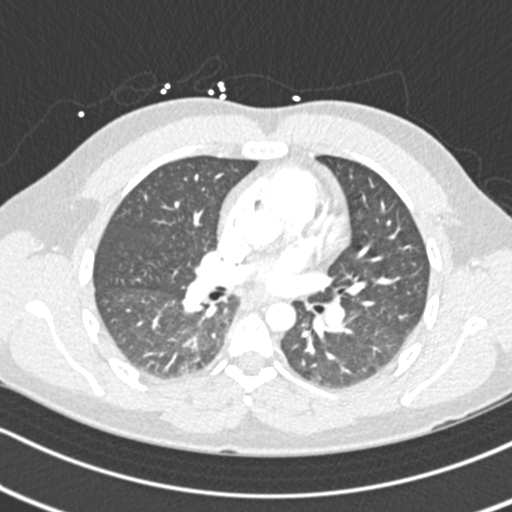
[im 130/224  mediastinal]
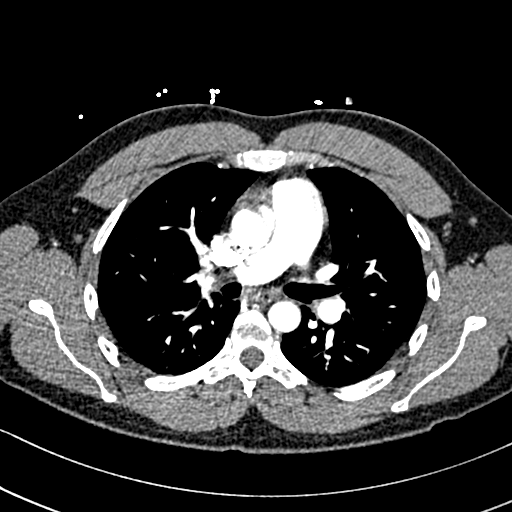
[im 141/224  lung]
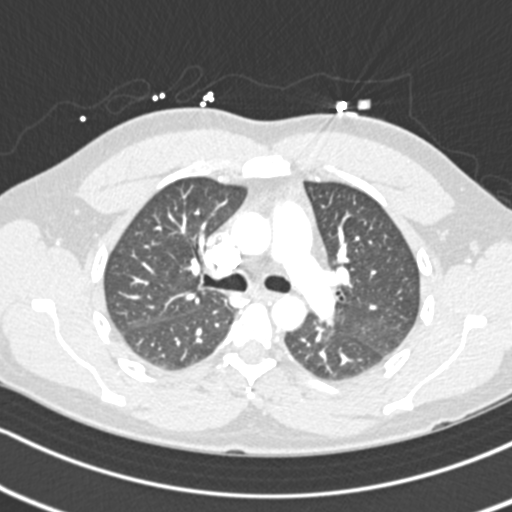
[im 153/224  mediastinal]
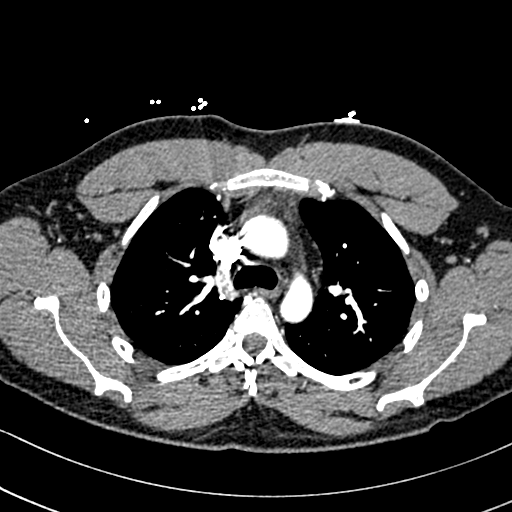
[im 165/224  lung]
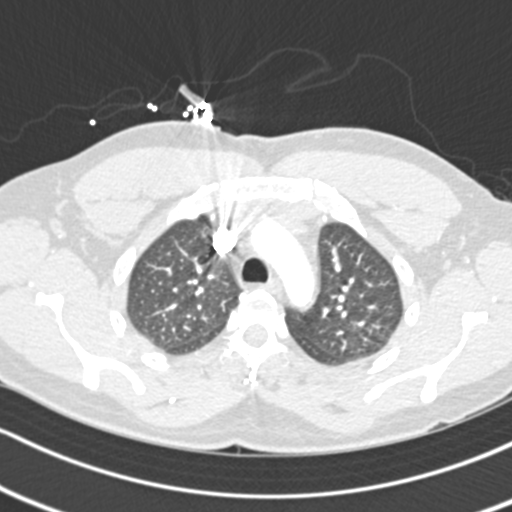
[im 177/224  mediastinal]
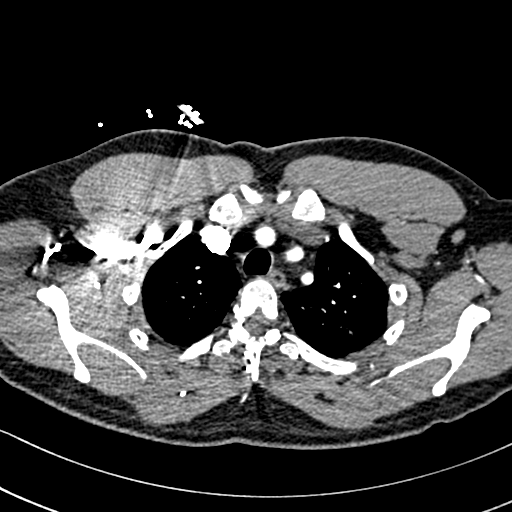
[im 188/224  lung]
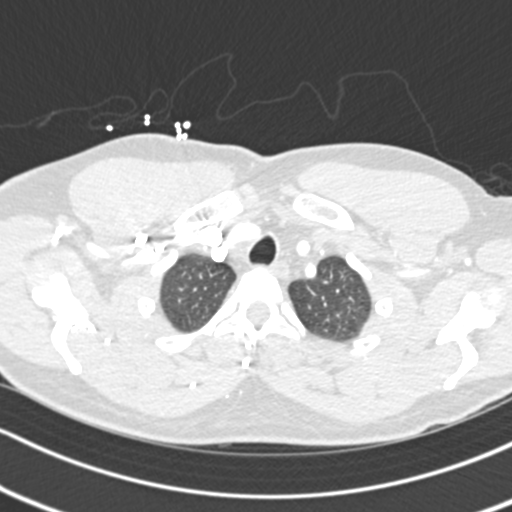
[im 200/224  mediastinal]
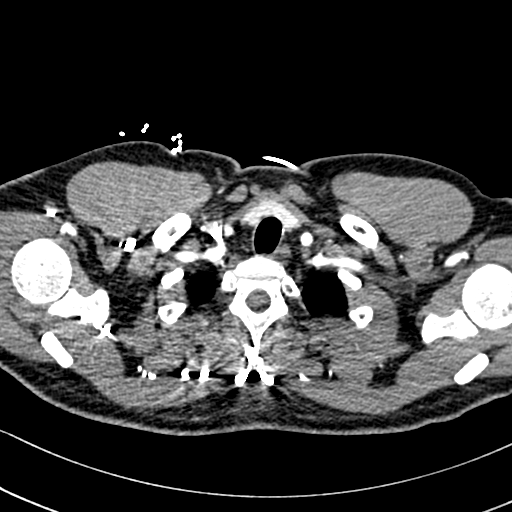
[im 212/224  lung]
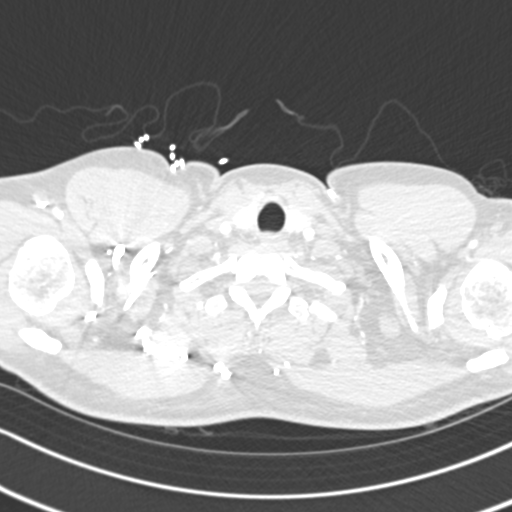

[18 of 36 positions shown; findings below may reference images not displayed]

FINDINGS: Cardiovascular: There is no demonstrable pulmonary embolus. There is
no thoracic aortic aneurysm or dissection. Visualized great vessels
appear unremarkable. There is no appreciable pericardial effusion or
pericardial thickening.

Mediastinum/Nodes: Visualized thyroid appears unremarkable. There is
no evident thoracic adenopathy. There is a small hiatal hernia.

Lungs/Pleura: There is no appreciable edema or consolidation. No
pleural effusions are evident. A previously noted 6 mm nodular
opacity on the right in the right middle lobe region along the right
minor fissure is stable, currently seen on axial slice 41 series 6.
Previously noted 3 mm nodular opacity in the right upper lobe is not
appreciable currently. No new pulmonary nodular lesions are evident.

Upper Abdomen: Visualized upper abdominal structures appear
unremarkable.

Musculoskeletal: No blastic or lytic bone lesions. No evident chest
wall lesions.

Review of the MIP images confirms the above findings.
IMPRESSION: 1. No demonstrable pulmonary embolus. No thoracic aortic aneurysm or
dissection.

2. No edema or consolidation. No pleural effusion. Stable 6 mm
nodular opacity in the right middle lobe region, a potential
perifissural lymph node. No new pulmonary nodular opacities evident.

3.  No evident adenopathy.

4.  Small hiatal hernia.

## 2021-10-07 DIAGNOSIS — F840 Autistic disorder: Secondary | ICD-10-CM | POA: Diagnosis not present

## 2021-10-10 DIAGNOSIS — F840 Autistic disorder: Secondary | ICD-10-CM | POA: Diagnosis not present

## 2021-10-12 DIAGNOSIS — F840 Autistic disorder: Secondary | ICD-10-CM | POA: Diagnosis not present

## 2021-10-14 DIAGNOSIS — F840 Autistic disorder: Secondary | ICD-10-CM | POA: Diagnosis not present

## 2021-10-17 DIAGNOSIS — F840 Autistic disorder: Secondary | ICD-10-CM | POA: Diagnosis not present

## 2021-10-19 DIAGNOSIS — F840 Autistic disorder: Secondary | ICD-10-CM | POA: Diagnosis not present

## 2021-10-26 DIAGNOSIS — F840 Autistic disorder: Secondary | ICD-10-CM | POA: Diagnosis not present

## 2021-10-27 DIAGNOSIS — F840 Autistic disorder: Secondary | ICD-10-CM | POA: Diagnosis not present

## 2021-10-28 DIAGNOSIS — F840 Autistic disorder: Secondary | ICD-10-CM | POA: Diagnosis not present

## 2021-10-31 DIAGNOSIS — F840 Autistic disorder: Secondary | ICD-10-CM | POA: Diagnosis not present

## 2021-11-02 DIAGNOSIS — F840 Autistic disorder: Secondary | ICD-10-CM | POA: Diagnosis not present

## 2021-11-04 DIAGNOSIS — F840 Autistic disorder: Secondary | ICD-10-CM | POA: Diagnosis not present

## 2021-11-07 DIAGNOSIS — F840 Autistic disorder: Secondary | ICD-10-CM | POA: Diagnosis not present

## 2021-11-09 DIAGNOSIS — F840 Autistic disorder: Secondary | ICD-10-CM | POA: Diagnosis not present

## 2021-11-11 DIAGNOSIS — F840 Autistic disorder: Secondary | ICD-10-CM | POA: Diagnosis not present

## 2021-11-14 DIAGNOSIS — F840 Autistic disorder: Secondary | ICD-10-CM | POA: Diagnosis not present

## 2021-11-15 DIAGNOSIS — F840 Autistic disorder: Secondary | ICD-10-CM | POA: Diagnosis not present

## 2021-11-16 DIAGNOSIS — F840 Autistic disorder: Secondary | ICD-10-CM | POA: Diagnosis not present

## 2022-01-24 ENCOUNTER — Encounter: Payer: Self-pay | Admitting: Family Medicine

## 2022-01-24 ENCOUNTER — Other Ambulatory Visit (HOSPITAL_COMMUNITY): Payer: Self-pay

## 2022-01-24 ENCOUNTER — Ambulatory Visit (INDEPENDENT_AMBULATORY_CARE_PROVIDER_SITE_OTHER): Payer: 59 | Admitting: Family Medicine

## 2022-01-24 ENCOUNTER — Other Ambulatory Visit: Payer: Self-pay | Admitting: Family Medicine

## 2022-01-24 VITALS — BP 112/68 | HR 82 | Temp 98.2°F | Ht 64.0 in | Wt 159.1 lb

## 2022-01-24 DIAGNOSIS — Z0000 Encounter for general adult medical examination without abnormal findings: Secondary | ICD-10-CM | POA: Diagnosis not present

## 2022-01-24 DIAGNOSIS — E559 Vitamin D deficiency, unspecified: Secondary | ICD-10-CM

## 2022-01-24 LAB — CBC
HCT: 40.5 % (ref 39.0–52.0)
Hemoglobin: 13.3 g/dL (ref 13.0–17.0)
MCHC: 32.8 g/dL (ref 30.0–36.0)
MCV: 80.7 fl (ref 78.0–100.0)
Platelets: 202 10*3/uL (ref 150.0–400.0)
RBC: 5.03 Mil/uL (ref 4.22–5.81)
RDW: 13.7 % (ref 11.5–15.5)
WBC: 5.2 10*3/uL (ref 4.0–10.5)

## 2022-01-24 LAB — HEMOGLOBIN A1C: Hgb A1c MFr Bld: 5.8 % (ref 4.6–6.5)

## 2022-01-24 LAB — COMPREHENSIVE METABOLIC PANEL
ALT: 28 U/L (ref 0–53)
AST: 19 U/L (ref 0–37)
Albumin: 4.2 g/dL (ref 3.5–5.2)
Alkaline Phosphatase: 43 U/L (ref 39–117)
BUN: 18 mg/dL (ref 6–23)
CO2: 28 mEq/L (ref 19–32)
Calcium: 9 mg/dL (ref 8.4–10.5)
Chloride: 105 mEq/L (ref 96–112)
Creatinine, Ser: 0.99 mg/dL (ref 0.40–1.50)
GFR: 96.98 mL/min (ref >60.00)
Glucose, Bld: 86 mg/dL (ref 70–99)
Potassium: 4.6 mEq/L (ref 3.5–5.1)
Sodium: 139 mEq/L (ref 135–145)
Total Bilirubin: 0.6 mg/dL (ref 0.2–1.2)
Total Protein: 6.7 g/dL (ref 6.0–8.3)

## 2022-01-24 LAB — LIPID PANEL
Cholesterol: 143 mg/dL (ref 0–200)
HDL: 34.1 mg/dL — ABNORMAL LOW (ref >39.00)
LDL Cholesterol: 97 mg/dL (ref 0–99)
NonHDL: 108.98
Total CHOL/HDL Ratio: 4
Triglycerides: 60 mg/dL (ref 0.0–149.0)
VLDL: 12 mg/dL (ref 0.0–40.0)

## 2022-01-24 LAB — VITAMIN D 25 HYDROXY (VIT D DEFICIENCY, FRACTURES): VITD: 19.76 ng/mL — ABNORMAL LOW (ref 30.00–100.00)

## 2022-01-24 MED ORDER — VITAMIN D (ERGOCALCIFEROL) 1.25 MG (50000 UNIT) PO CAPS
50000.0000 [IU] | ORAL_CAPSULE | ORAL | 0 refills | Status: DC
  Filled 2022-01-24: qty 12, 84d supply, fill #0

## 2022-01-24 NOTE — Patient Instructions (Signed)
Give us 2-3 business days to get the results of your labs back.   Keep the diet clean and stay active.  Please get me a copy of your advanced directive form at your convenience.   Do monthly self testicular checks in the shower. You are feeling for lumps/bumps that don't belong. If you feel anything like this, let me know!  Let us know if you need anything.  

## 2022-01-24 NOTE — Progress Notes (Signed)
Chief Complaint  Patient presents with   Annual Exam    Well Male Reginald Dodson is here for a complete physical.   His last physical was >1 year ago.  Current diet: in general, a "healthy" diet.   Current exercise: cardio Weight trend: stable Fatigue out of ordinary? No. Seat belt? Yes.   Advanced directive? Yes  Health maintenance Tetanus- Yes HIV- Yes Hep C- Yes  Past Medical History:  Diagnosis Date   GERD (gastroesophageal reflux disease)    History of pulmonary embolus (PE)    Thought to be related to travel     Past Surgical History:  Procedure Laterality Date   APPENDECTOMY      Medications  Current Outpatient Medications on File Prior to Visit  Medication Sig Dispense Refill   cholecalciferol (VITAMIN D3) 25 MCG (1000 UT) tablet Take 1,000 Units by mouth daily.     Omega-3 Fatty Acids (FISH OIL) 1000 MG CAPS Take by mouth.     Allergies No Known Allergies  Family History Family History  Problem Relation Age of Onset   Healthy Mother    Healthy Father    Cancer Neg Hx    Clotting disorder Neg Hx     Review of Systems: Constitutional: no fevers or chills Eye:  no recent significant change in vision Ear/Nose/Mouth/Throat:  Ears:  no hearing loss Nose/Mouth/Throat:  no complaints of nasal congestion, no sore throat Cardiovascular:  no chest pain Respiratory:  no shortness of breath Gastrointestinal:  no abdominal pain, no change in bowel habits GU:  Male: negative for dysuria Musculoskeletal/Extremities:  no pain of the joints Integumentary (Skin/Breast):  no abnormal skin lesions reported Neurologic:  no headaches Endocrine: No unexpected weight changes Hematologic/Lymphatic:  no night sweats  Exam BP 112/68   Pulse 82   Temp 98.2 F (36.8 C) (Oral)   Ht 5\' 4"  (1.626 m)   Wt 159 lb 2 oz (72.2 kg)   SpO2 97%   BMI 27.31 kg/m  General:  well developed, well nourished, in no apparent distress Skin:  no significant moles, warts, or  growths Head:  no masses, lesions, or tenderness Eyes:  pupils equal and round, sclera anicteric without injection Ears:  canals without lesions, TMs shiny without retraction, no obvious effusion, no erythema Nose:  nares patent, septum midline, mucosa normal Throat/Pharynx:  lips and gingiva without lesion; tongue and uvula midline; non-inflamed pharynx; no exudates or postnasal drainage Neck: neck supple without adenopathy, thyromegaly, or masses Lungs:  clear to auscultation, breath sounds equal bilaterally, no respiratory distress Cardio:  regular rate and rhythm, no bruits, no LE edema Abdomen:  abdomen soft, nontender; bowel sounds normal; no masses or organomegaly Genital (male): Deferred Rectal: Deferred Musculoskeletal:  symmetrical muscle groups noted without atrophy or deformity Extremities:  no clubbing, cyanosis, or edema, no deformities, no skin discoloration Neuro:  gait normal; deep tendon reflexes normal and symmetric Psych: well oriented with normal range of affect and appropriate judgment/insight  Assessment and Plan  Well adult exam - Plan: CBC, Comprehensive metabolic panel, Lipid panel, Hemoglobin A1c  Vitamin D insufficiency - Plan: VITAMIN D 25 Hydroxy (Vit-D Deficiency, Fractures)   Well 36 y.o. male. Counseled on diet and exercise. Self testicular exams recommended at least monthly.  Advanced directive form requested today.  Other orders as above. Follow up in 1 year pending the above workup. The patient voiced understanding and agreement to the plan.  [address] [place], DO 01/24/22 9:30 AM

## 2022-07-22 ENCOUNTER — Encounter: Payer: Self-pay | Admitting: Emergency Medicine

## 2022-07-22 ENCOUNTER — Ambulatory Visit
Admission: EM | Admit: 2022-07-22 | Discharge: 2022-07-22 | Disposition: A | Discharge status: Home / Self Care | Payer: 59 | Attending: Family Medicine | Admitting: Family Medicine

## 2022-07-22 DIAGNOSIS — U071 COVID-19: Secondary | ICD-10-CM

## 2022-07-22 LAB — POC SARS CORONAVIRUS 2 AG -  ED: SARS Coronavirus 2 Ag: POSITIVE — AB

## 2022-07-22 LAB — POCT INFLUENZA A/B
Influenza A, POC: NEGATIVE
Influenza B, POC: NEGATIVE

## 2022-07-22 MED ORDER — PAXLOVID (300/100) 20 X 150 MG & 10 X 100MG PO TBPK: ORAL_TABLET | ORAL | 0 refills | Status: DC

## 2022-07-22 NOTE — ED Triage Notes (Signed)
Patient c/o flu like sx's, cough, congestion, body aches, malaise, fever since yesterday.  Patient has taken Tylenol and Claritin.

## 2022-07-22 NOTE — Discharge Instructions (Signed)
Call health at work  I have prescribed Paxlovid.  Take 2 times a day for 5 days Call for problems

## 2022-07-22 NOTE — ED Provider Notes (Signed)
[facility]    CSN: [id number] Arrival date & time: 07/22/22  1129      History   Chief Complaint Chief Complaint  Patient presents with   Cough    HPI Reginald Dodson is a 37 y.o. male.   HPI  Patient is a hospital list doctor.  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  He developed fever chills body aches headaches malaise yesterday with some cough and congestion.  He is taken some over-the-counter medication.  He has not done any testing  Past Medical History:  Diagnosis Date   GERD (gastroesophageal reflux disease)    History of pulmonary embolus (PE)    Thought to be related to travel    Patient Active Problem List   Diagnosis Date Noted   Gastroesophageal reflux disease 12/30/2018   GERD (gastroesophageal reflux disease)     Past Surgical History:  Procedure Laterality Date   APPENDECTOMY         Home Medications    Prior to Admission medications   Medication Sig Start Date End Date Taking? Authorizing Provider  nirmatrelvir & ritonavir (PAXLOVID, 300/100,) 20 x 150 MG & 10 x 100MG  TBPK Take as directed 07/22/22  Yes 09/20/22, MD  cholecalciferol (VITAMIN D3) 25 MCG (1000 UT) tablet Take 1,000 Units by mouth daily.    [provider]  Omega-3 Fatty Acids (FISH OIL) 1000 MG CAPS Take by mouth.    [provider]  Vitamin D, Ergocalciferol, (DRISDOL) 1.25 MG (50000 UNIT) CAPS capsule Take 1 capsule (50,000 Units total) by mouth every 7 (seven) days. 01/24/22   03/27/22, DO    Family History Family History  Problem Relation Age of Onset   Healthy Mother    Healthy Father    Cancer Neg Hx    Clotting disorder Neg Hx     Social History Social History   Tobacco Use   Smoking status: Never   Smokeless tobacco: Never  Substance Use Topics   Alcohol use: Yes    Comment: rare   Drug use: Never     Allergies   Patient has no known allergies.   Review of Systems Review of  Systems  HPI Physical Exam Triage Vital Signs ED Triage Vitals  Enc Vitals Group     BP 07/22/22 1224 122/77     Pulse Rate 07/22/22 1224 99     Resp 07/22/22 1224 18     Temp 07/22/22 1224 99.3 F (37.4 C)     Temp Source 07/22/22 1224 Oral     SpO2 07/22/22 1224 97 %     Weight 07/22/22 1225 152 lb (68.9 kg)     Height 07/22/22 1225 5\' 5"  (1.651 m)     Head Circumference --      Peak Flow --      Pain Score 07/22/22 1225 2     Pain Loc --      Pain Edu? --      Excl. in GC? --    No data found.  Updated Vital Signs BP 122/77 (BP Location: Right Arm)   Pulse 99   Temp 99.3 F (37.4 C) (Oral)   Resp 18   Ht 5\' 5"  (1.651 m)   Wt 68.9 kg   SpO2 97%   BMI 25.29 kg/m      Physical Exam Constitutional:      General: He is not in acute distress.    Appearance: He is well-developed.  HENT:     Head: Normocephalic and atraumatic.  Eyes:     Conjunctiva/sclera: Conjunctivae normal.     Pupils: Pupils are equal, round, and reactive to light.  Cardiovascular:     Rate and Rhythm: Normal rate.  Pulmonary:     Effort: Pulmonary effort is normal. No respiratory distress.  Musculoskeletal:        General: Normal range of motion.     Cervical back: Normal range of motion.  Skin:    General: Skin is warm and dry.  Neurological:     Mental Status: He is alert.      UC Treatments / Results  Labs (all labs ordered are listed, but only abnormal results are displayed) Labs Reviewed  POC SARS CORONAVIRUS 2 AG -  ED - Abnormal; Notable for the following components:      Result Value   SARS Coronavirus 2 Ag Positive (*)    All other components within normal limits  POCT INFLUENZA A/B    EKG   Radiology No results found.  Procedures Procedures (including critical care time)  Medications Ordered in UC Medications - No data to display  Initial Impression / Assessment and Plan / UC Course  I have reviewed the triage vital signs and the nursing  notes.  Pertinent labs & imaging results that were available during my care of the patient were reviewed by me and considered in my medical decision making (see chart for details).     Final Clinical Impressions(s) / UC Diagnoses   Final diagnoses:  [id number]     Discharge Instructions      Call health at work  I have prescribed Paxlovid.  Take 2 times a day for 5 days Call for problems   ED Prescriptions     Medication Sig Dispense Auth. Provider   nirmatrelvir & ritonavir (PAXLOVID, 300/100,) 20 x 150 MG & 10 x 100MG  TBPK Take as directed 1 each Reginald Coffee Jennette Banker, MD      PDMP not reviewed this encounter.   Raylene Everts, MD 07/22/22 1321

## 2022-07-23 ENCOUNTER — Telehealth: Payer: Self-pay | Admitting: Emergency Medicine

## 2022-07-23 NOTE — Telephone Encounter (Signed)
LMTRC.  Advised if doing well to disregard the call.  Any questions or concerns feel free to contact the office. 

## 2022-08-08 DIAGNOSIS — H5213 Myopia, bilateral: Secondary | ICD-10-CM | POA: Diagnosis not present

## 2022-08-08 DIAGNOSIS — H52221 Regular astigmatism, right eye: Secondary | ICD-10-CM | POA: Diagnosis not present

## 2022-11-09 IMAGING — CT CT CARDIAC CORONARY ARTERY CALCIUM SCORE
2 series · 15 of 20 positions shown, 17 images · non-contrast
Comparison: CT a of the chest on 07/16/2019
COMPARISON: CT a of the chest on 07/16/2019

Addendum:
EXAM:
OVER-READ INTERPRETATION  CT CHEST

The following report is an over-read performed by radiologist Dr.
over-read does not include interpretation of cardiac or coronary
anatomy or pathology. The coronary calcium score interpretation by
the cardiologist is attached.
CLINICAL DATA: Cardiovascular Disease Risk stratification
Coronary Calcium Score
TECHNIQUE: A gated, non-contrast computed tomography scan of the heart was
performed using 3mm slice thickness. Axial images were analyzed on a
dedicated workstation. Calcium scoring of the coronary arteries was
performed using the Agatston method.

[Series 2: casc 3.0 i36f 2 bestdiast 68 % · axial · 0.39mm/px · z∈[-236,-137]mm · 8 of 43 slices shown, 10 images]
[im 5/43  vessel]
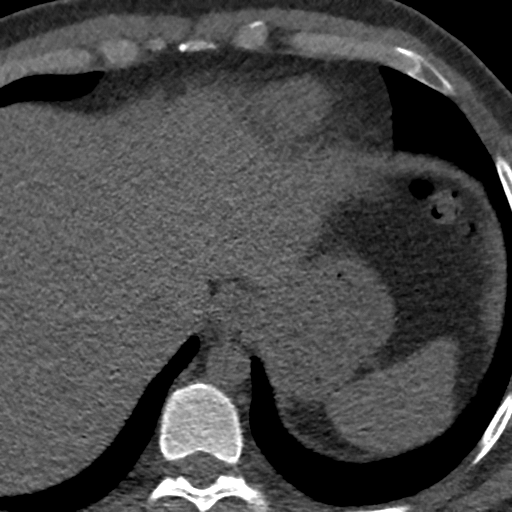
[im 5/43  lung]
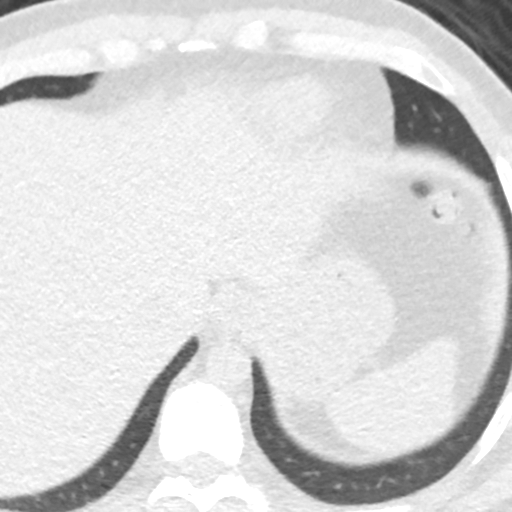
[im 10/43  vessel]
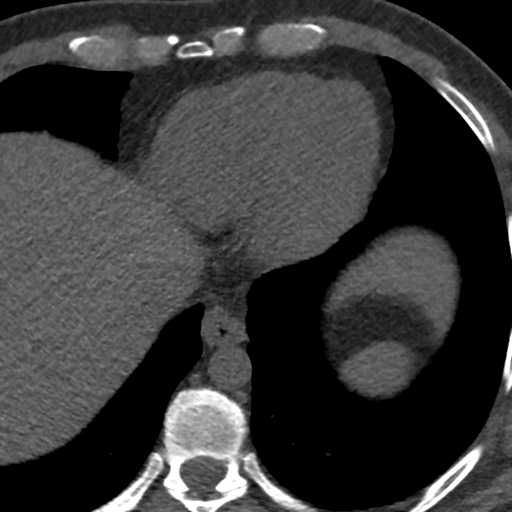
[im 15/43  vessel]
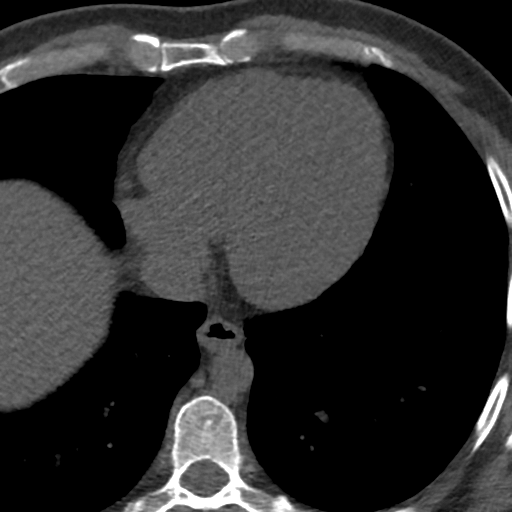
[im 19/43  vessel]
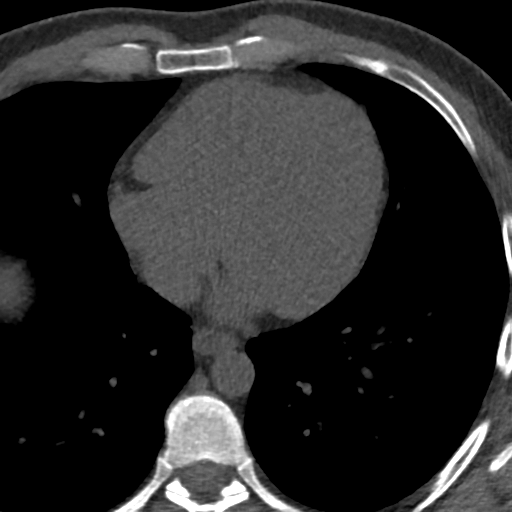
[im 24/43  vessel]
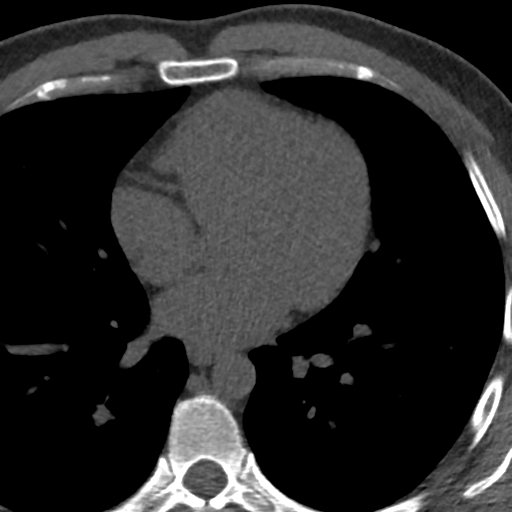
[im 24/43  lung]
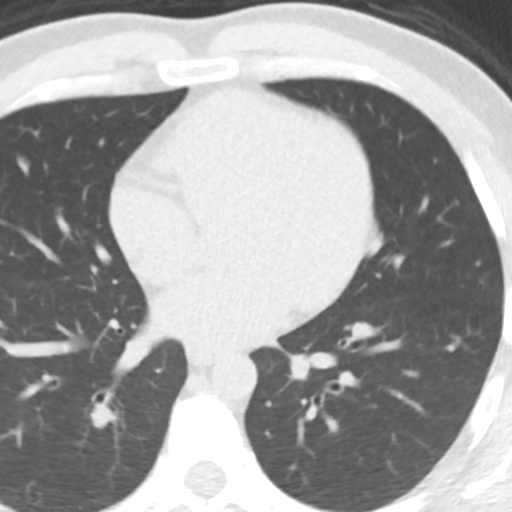
[im 29/43  vessel]
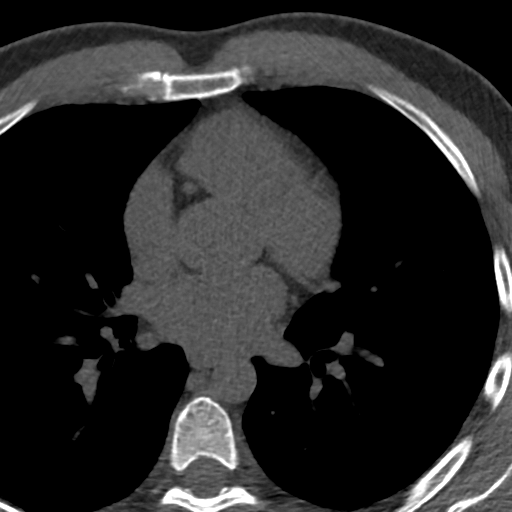
[im 33/43  vessel]
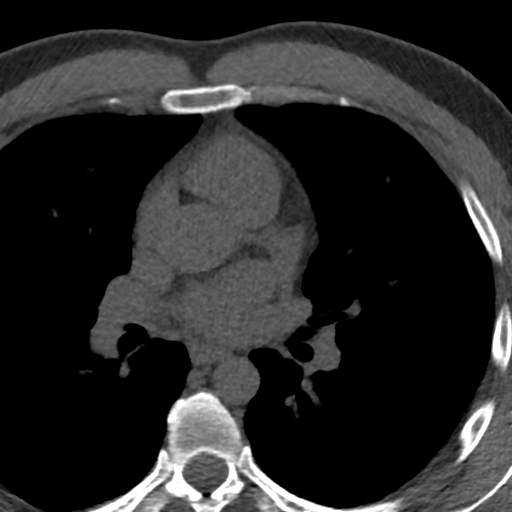
[im 38/43  vessel]
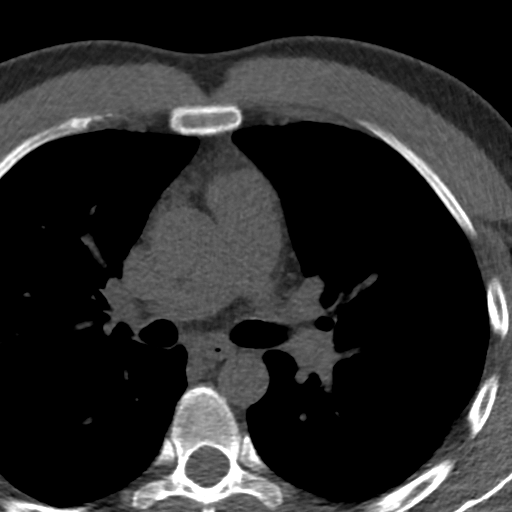

[Series 4: lung st 66 % · axial · 0.67mm/px · z∈[-236,-152]mm · 7 of 43 slices shown]
[im 5/43  lung]
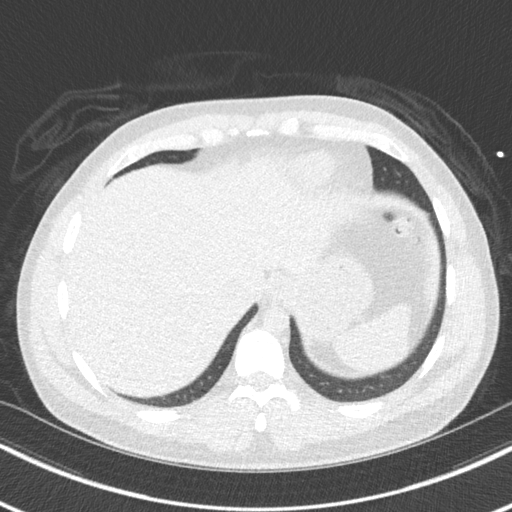
[im 10/43  lung]
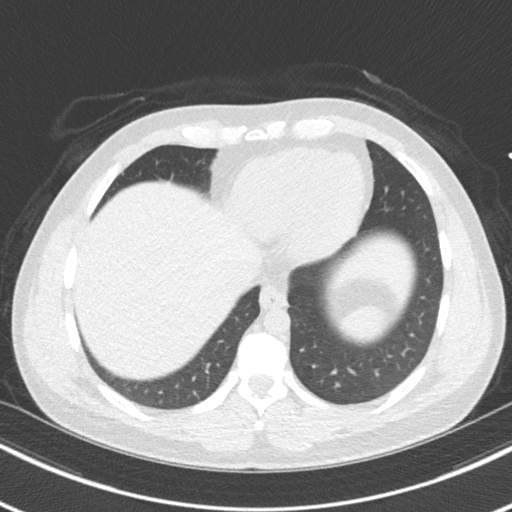
[im 15/43  lung]
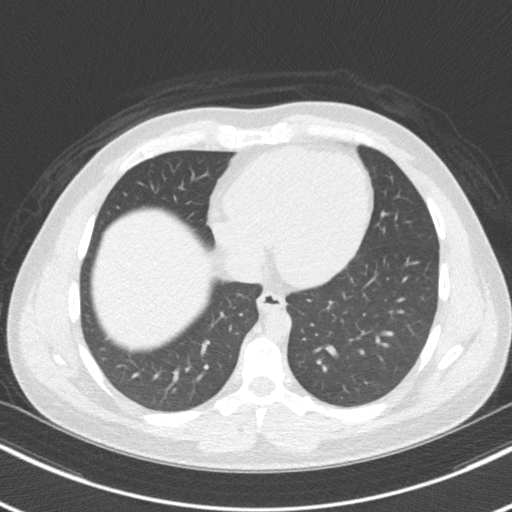
[im 19/43  lung]
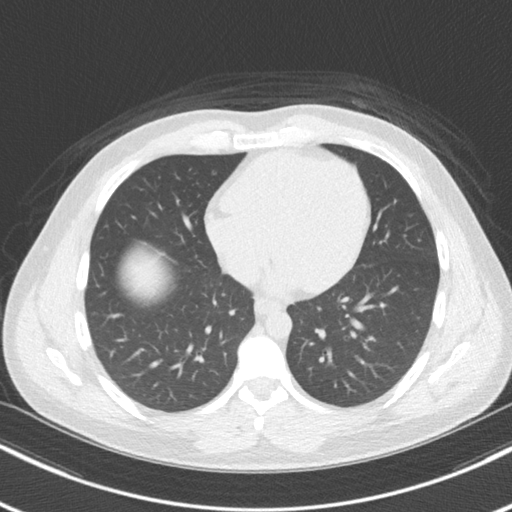
[im 24/43  lung]
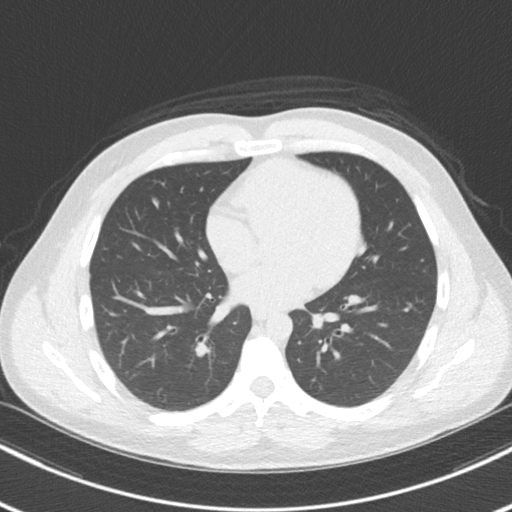
[im 29/43  lung]
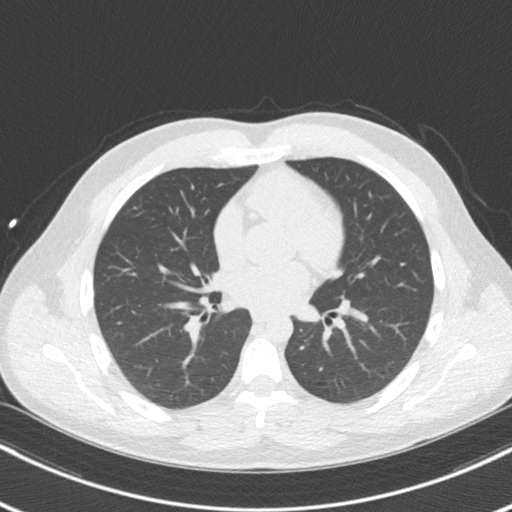
[im 33/43  lung]
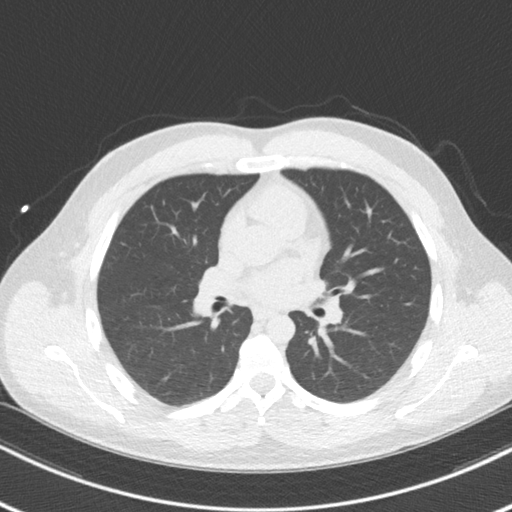

[15 of 20 positions shown; findings below may reference images not displayed]

FINDINGS: Vascular: No significant noncardiac vascular findings.

Mediastinum/Nodes: Visualized mediastinum and hilar regions
demonstrate no lymphadenopathy or masses.

Lungs/Pleura: Stable area focal fissural thickening of the right
minor fissure. There is no evidence of pulmonary edema,
consolidation, pneumothorax or pleural fluid.

Upper Abdomen: No acute abnormality.

Musculoskeletal: No chest wall mass or suspicious bone lesions
identified.
IMPRESSION: No significant noncardiac findings.
FINDINGS: Coronary arteries: Normal origins.

Coronary Calcium Score:

Left main: 0

Left anterior descending artery: 0

Left circumflex artery: 0

Right coronary artery: 0

Total: 0

Percentile: 0

Pericardium: Normal.

Aorta: Normal caliber of ascending aorta. No aortic atherosclerosis
noted.

Non-cardiac: See separate report from [REDACTED].
IMPRESSION: Coronary calcium score of 0. This was 0 percentile for age-, race-,
and sex-matched controls.



If CAC=0, it is reasonable to withhold statin therapy and reassess
in 5 to 10 years, as long as higher risk conditions are absent
(diabetes mellitus, family history of premature CHD in first degree
relatives (males <55 years; females <65 years), cigarette smoking,
or LDL >=190 mg/dL).

If CAC is 1 to 99, it is reasonable to initiate statin therapy for
patients >=55 years of age.

If CAC is >=100 or >=75th percentile, it is reasonable to initiate
statin therapy at any age.

Cardiology referral should be considered for patients with CAC
scores >=400 or >=75th percentile.

*4855 AHA/ACC/AACVPR/AAPA/ABC/COBO/EVANGELISTA REYNALDO/JIM/Klpigbb/GOXY/[place]
Guideline on the Management of Blood Cholesterol: A Report of the
American College of Cardiology/American Heart Association Task Force
on Clinical Practice Guidelines. J Am Coll Cardiol.
0197;73(24):4167-4559.

*** End of Addendum ***
EXAM:
OVER-READ INTERPRETATION  CT CHEST

The following report is an over-read performed by radiologist Dr.
over-read does not include interpretation of cardiac or coronary
anatomy or pathology. The coronary calcium score interpretation by
the cardiologist is attached.
FINDINGS: Vascular: No significant noncardiac vascular findings.

Mediastinum/Nodes: Visualized mediastinum and hilar regions
demonstrate no lymphadenopathy or masses.

Lungs/Pleura: Stable area focal fissural thickening of the right
minor fissure. There is no evidence of pulmonary edema,
consolidation, pneumothorax or pleural fluid.

Upper Abdomen: No acute abnormality.

Musculoskeletal: No chest wall mass or suspicious bone lesions
identified.
IMPRESSION: No significant noncardiac findings.

## 2022-11-13 ENCOUNTER — Encounter: Payer: 59 | Admitting: Family Medicine

## 2022-12-18 ENCOUNTER — Other Ambulatory Visit: Payer: Self-pay | Admitting: Family Medicine

## 2022-12-18 ENCOUNTER — Encounter: Payer: Self-pay | Admitting: Family Medicine

## 2022-12-18 MED ORDER — RIVAROXABAN 10 MG PO TABS: 10.0000 mg | ORAL_TABLET | Freq: Every day | ORAL | 0 refills | Status: DC

## 2022-12-28 ENCOUNTER — Other Ambulatory Visit: Payer: Self-pay | Admitting: Family Medicine

## 2023-04-17 ENCOUNTER — Other Ambulatory Visit: Payer: Self-pay | Admitting: Family Medicine

## 2023-04-17 ENCOUNTER — Other Ambulatory Visit (HOSPITAL_COMMUNITY): Payer: Self-pay

## 2023-04-17 ENCOUNTER — Encounter: Payer: Self-pay | Admitting: Family Medicine

## 2023-04-17 MED ORDER — PREDNISONE 20 MG PO TABS
40.0000 mg | ORAL_TABLET | Freq: Every day | ORAL | 0 refills | Status: AC
  Filled 2023-04-17 (×3): qty 10, 5d supply, fill #0

## 2023-04-30 ENCOUNTER — Other Ambulatory Visit (HOSPITAL_COMMUNITY): Payer: Self-pay

## 2023-08-20 ENCOUNTER — Ambulatory Visit
Admission: RE | Admit: 2023-08-20 | Discharge: 2023-08-20 | Disposition: A | Discharge status: Home / Self Care | Payer: 59 | Source: Ambulatory Visit | Attending: Family Medicine | Admitting: Family Medicine

## 2023-08-20 VITALS — BP 121/74 | HR 122 | Temp 102.1°F | Resp 18 | Ht 65.0 in | Wt 150.0 lb

## 2023-08-20 DIAGNOSIS — Z20828 Contact with and (suspected) exposure to other viral communicable diseases: Secondary | ICD-10-CM | POA: Diagnosis not present

## 2023-08-20 DIAGNOSIS — R059 Cough, unspecified: Secondary | ICD-10-CM

## 2023-08-20 DIAGNOSIS — R509 Fever, unspecified: Secondary | ICD-10-CM

## 2023-08-20 LAB — POCT INFLUENZA A/B
Influenza A, POC: NEGATIVE
Influenza B, POC: NEGATIVE

## 2023-08-20 MED ORDER — OSELTAMIVIR PHOSPHATE 75 MG PO CAPS: 75.0000 mg | ORAL_CAPSULE | Freq: Two times a day (BID) | ORAL | 0 refills | Status: DC

## 2023-08-20 MED ORDER — PROMETHAZINE-DM 6.25-15 MG/5ML PO SYRP: 5.0000 mL | ORAL_SOLUTION | Freq: Two times a day (BID) | ORAL | 0 refills | Status: DC | PRN

## 2023-08-20 MED ORDER — BENZONATATE 200 MG PO CAPS: 200.0000 mg | ORAL_CAPSULE | Freq: Three times a day (TID) | ORAL | 0 refills | Status: AC | PRN

## 2023-08-20 NOTE — Discharge Instructions (Addendum)
Advised Father to take medication as directed with food to completion.  Advised may take OTC Tylenol 1 g every 6 hours for fever (oral temperature greater than 100.3) advised may take Tessalon capsules daily or as needed for cough.  Advised may take promethazine DM at night for cough due to sedative effects.  Encouraged increase daily water intake to 64 ounces per day while taking these medications.

## 2023-08-20 NOTE — ED Provider Notes (Signed)
[facility]    CSN: [id number] Arrival date & time: 08/20/23  1747      History   Chief Complaint Chief Complaint  Patient presents with   Cough    Suspect flu.. Daughter has flu at home..  my son Anvit also has similar symptoms. I want to bring him as well for an appointment.. - Entered by patient    HPI Reginald Dodson is a 38 y.o. male.   HPI 38 year old male presents with cough and fever this morning.  Reports daughter at home has tested positive for influenza A.  PMH significant for history of PE (currently on rivaroxaban and denies any unusual bleeding) and GERD.  Past Medical History:  Diagnosis Date   GERD (gastroesophageal reflux disease)    History of pulmonary embolus (PE)    Thought to be related to travel    Patient Active Problem List   Diagnosis Date Noted   Gastroesophageal reflux disease 12/30/2018   GERD (gastroesophageal reflux disease)     Past Surgical History:  Procedure Laterality Date   APPENDECTOMY         Home Medications    Prior to Admission medications   Medication Sig Start Date End Date Taking? Authorizing Provider  benzonatate (TESSALON) 200 MG capsule Take 1 capsule (200 mg total) by mouth 3 (three) times daily as needed for up to 7 days. 08/20/23 08/27/23 Yes Trevor Iha, FNP  cholecalciferol (VITAMIN D3) 25 MCG (1000 UT) tablet Take 1,000 Units by mouth daily.   Yes [provider]  Omega-3 Fatty Acids (FISH OIL) 1000 MG CAPS Take by mouth.   Yes [provider]  oseltamivir (TAMIFLU) 75 MG capsule Take 1 capsule (75 mg total) by mouth every 12 (twelve) hours. 08/20/23  Yes Trevor Iha, FNP  promethazine-dextromethorphan (PROMETHAZINE-DM) 6.25-15 MG/5ML syrup Take 5 mLs by mouth 2 (two) times daily as needed for cough. 08/20/23  Yes Trevor Iha, FNP  Vitamin D, Ergocalciferol, (DRISDOL) 1.25 MG (50000 UNIT) CAPS capsule Take 1 capsule (50,000 Units total) by mouth every 7 (seven) days. 01/24/22  Yes  Wendling, Jilda Roche, DO  rivaroxaban (XARELTO) 10 MG TABS tablet Take 1 tablet (10 mg total) by mouth daily. 12/18/22   Sharlene Dory, DO    Family History Family History  Problem Relation Age of Onset   Healthy Mother    Healthy Father    Cancer Neg Hx    Clotting disorder Neg Hx     Social History Social History   Tobacco Use   Smoking status: Never   Smokeless tobacco: Never  Vaping Use   Vaping status: Never Used  Substance Use Topics   Alcohol use: Yes    Comment: rare   Drug use: Never     Allergies   Patient has no known allergies.   Review of Systems Review of Systems  Constitutional:  Positive for fever.  HENT:  Positive for congestion.   Respiratory:  Positive for cough.      Physical Exam Triage Vital Signs ED Triage Vitals  Encounter Vitals Group     BP      Systolic BP Percentile      Diastolic BP Percentile      Pulse      Resp      Temp      Temp src      SpO2      Weight      Height      Head Circumference  Peak Flow      Pain Score      Pain Loc      Pain Education      Exclude from Growth Chart    No data found.  Updated Vital Signs BP 121/74 (BP Location: Left Arm)   Pulse (!) 122   Temp (!) 102.1 F (38.9 C) (Oral)   Resp 18   Ht 5\' 5"  (1.651 m)   Wt 150 lb (68 kg)   SpO2 97%   BMI 24.96 kg/m    Physical Exam Vitals and nursing note reviewed.  Constitutional:      Appearance: Normal appearance. He is normal weight.  HENT:     Head: Normocephalic and atraumatic.     Right Ear: Tympanic membrane, ear canal and external ear normal.     Left Ear: Tympanic membrane, ear canal and external ear normal.     Mouth/Throat:     Mouth: Mucous membranes are moist.     Pharynx: Oropharynx is clear.  Eyes:     Extraocular Movements: Extraocular movements intact.     Conjunctiva/sclera: Conjunctivae normal.     Pupils: Pupils are equal, round, and reactive to light.  Cardiovascular:     Rate and Rhythm:  Normal rate and regular rhythm.     Pulses: Normal pulses.     Heart sounds: Normal heart sounds.  Pulmonary:     Effort: Pulmonary effort is normal.     Breath sounds: Normal breath sounds. No wheezing, rhonchi or rales.     Comments: Infrequent nonproductive cough on exam Musculoskeletal:        General: Normal range of motion.     Cervical back: Normal range of motion and neck supple.  Skin:    General: Skin is warm and dry.  Neurological:     General: No focal deficit present.     Mental Status: He is alert and oriented to person, place, and time. Mental status is at baseline.  Psychiatric:        Mood and Affect: Mood normal.        Behavior: Behavior normal.        Thought Content: Thought content normal.      UC Treatments / Results  Labs (all labs ordered are listed, but only abnormal results are displayed) Labs Reviewed  POCT INFLUENZA A/B - Normal    EKG   Radiology No results found.  Procedures Procedures (including critical care time)  Medications Ordered in UC Medications - No data to display  Initial Impression / Assessment and Plan / UC Course  I have reviewed the triage vital signs and the nursing notes.  Pertinent labs & imaging results that were available during my care of the patient were reviewed by me and considered in my medical decision making (see chart for details).     MDM: 1.  Exposure to influenza-Rx'd Tamiflu 75 mg capsule: Take 1 capsule twice daily x 5 days; 2.  Cough, unspecified type-Rx'd Tessalon 200 mg capsule: Take 1 capsule 3 times daily, as needed for cough, Rx'd Promethazine DM 6.25-15 Mg/5 mL syrup: Take 5 mL twice daily, as needed for cough; 3.  Fever, unspecified type-Advised may take OTC Tylenol 1 g every 6 hours for fever (oral temperature greater than 100.3). Advised Father to take medication as directed with food to completion.  Advised may take OTC Tylenol 1 g every 6 hours for fever (oral temperature greater than 100.3)  advised may take Tessalon capsules daily or  as needed for cough.  Advised may take promethazine DM at night for cough due to sedative effects.  Encouraged increase daily water intake to 64 ounces per day while taking these medications.   Final Clinical Impressions(s) / UC Diagnoses   Final diagnoses:  Cough, unspecified type  Fever, unspecified  Exposure to influenza     Discharge Instructions      Advised Father to take medication as directed with food to completion.  Advised may take OTC Tylenol 1 g every 6 hours for fever (oral temperature greater than 100.3) advised may take Tessalon capsules daily or as needed for cough.  Advised may take promethazine DM at night for cough due to sedative effects.  Encouraged increase daily water intake to 64 ounces per day while taking these medications.     ED Prescriptions     Medication Sig Dispense Auth. Provider   oseltamivir (TAMIFLU) 75 MG capsule Take 1 capsule (75 mg total) by mouth every 12 (twelve) hours. 10 capsule Trevor Iha, FNP   benzonatate (TESSALON) 200 MG capsule Take 1 capsule (200 mg total) by mouth 3 (three) times daily as needed for up to 7 days. 40 capsule Trevor Iha, FNP   promethazine-dextromethorphan (PROMETHAZINE-DM) 6.25-15 MG/5ML syrup Take 5 mLs by mouth 2 (two) times daily as needed for cough. 118 mL Trevor Iha, FNP      PDMP not reviewed this encounter.   Trevor Iha, FNP 08/20/23 [phone]

## 2023-08-20 NOTE — ED Triage Notes (Signed)
Patient c/o cough and fever this morning.  Daughter tested positive for flu yesterday.  Patient has taken Tylenol.

## 2023-09-12 ENCOUNTER — Other Ambulatory Visit: Payer: Self-pay

## 2023-09-12 ENCOUNTER — Encounter: Payer: Self-pay | Admitting: Family Medicine

## 2023-09-12 ENCOUNTER — Other Ambulatory Visit: Payer: Self-pay | Admitting: Family Medicine

## 2023-09-12 ENCOUNTER — Ambulatory Visit (INDEPENDENT_AMBULATORY_CARE_PROVIDER_SITE_OTHER): Payer: 59 | Admitting: Family Medicine

## 2023-09-12 VITALS — BP 118/72 | HR 79 | Temp 98.0°F | Resp 16 | Ht 65.0 in | Wt 150.0 lb

## 2023-09-12 DIAGNOSIS — E559 Vitamin D deficiency, unspecified: Secondary | ICD-10-CM

## 2023-09-12 DIAGNOSIS — R7303 Prediabetes: Secondary | ICD-10-CM

## 2023-09-12 DIAGNOSIS — Z0000 Encounter for general adult medical examination without abnormal findings: Secondary | ICD-10-CM | POA: Diagnosis not present

## 2023-09-12 DIAGNOSIS — E7800 Pure hypercholesterolemia, unspecified: Secondary | ICD-10-CM

## 2023-09-12 LAB — VITAMIN D 25 HYDROXY (VIT D DEFICIENCY, FRACTURES): VITD: 21.25 ng/mL — ABNORMAL LOW (ref 30.00–100.00)

## 2023-09-12 LAB — CBC
HCT: 43.3 % (ref 39.0–52.0)
Hemoglobin: 14.2 g/dL (ref 13.0–17.0)
MCHC: 32.8 g/dL (ref 30.0–36.0)
MCV: 82.4 fl (ref 78.0–100.0)
Platelets: 236 10*3/uL (ref 150.0–400.0)
RBC: 5.26 Mil/uL (ref 4.22–5.81)
RDW: 13.5 % (ref 11.5–15.5)
WBC: 5.6 10*3/uL (ref 4.0–10.5)

## 2023-09-12 LAB — LIPID PANEL
Cholesterol: 168 mg/dL (ref 0–200)
HDL: 33.7 mg/dL — ABNORMAL LOW (ref >39.00)
LDL Cholesterol: 120 mg/dL — ABNORMAL HIGH (ref 0–99)
NonHDL: 133.95
Total CHOL/HDL Ratio: 5
Triglycerides: 72 mg/dL (ref 0.0–149.0)
VLDL: 14.4 mg/dL (ref 0.0–40.0)

## 2023-09-12 LAB — COMPREHENSIVE METABOLIC PANEL
ALT: 39 U/L (ref 0–53)
AST: 27 U/L (ref 0–37)
Albumin: 4.3 g/dL (ref 3.5–5.2)
Alkaline Phosphatase: 48 U/L (ref 39–117)
BUN: 25 mg/dL — ABNORMAL HIGH (ref 6–23)
CO2: 29 meq/L (ref 19–32)
Calcium: 9.4 mg/dL (ref 8.4–10.5)
Chloride: 100 meq/L (ref 96–112)
Creatinine, Ser: 1.06 mg/dL (ref 0.40–1.50)
GFR: 88.33 mL/min (ref >60.00)
Glucose, Bld: 87 mg/dL (ref 70–99)
Potassium: 4.3 meq/L (ref 3.5–5.1)
Sodium: 137 meq/L (ref 135–145)
Total Bilirubin: 0.8 mg/dL (ref 0.2–1.2)
Total Protein: 7.2 g/dL (ref 6.0–8.3)

## 2023-09-12 LAB — HEMOGLOBIN A1C: Hgb A1c MFr Bld: 5.7 % (ref 4.6–6.5)

## 2023-09-12 MED ORDER — VITAMIN D (ERGOCALCIFEROL) 1.25 MG (50000 UNIT) PO CAPS: 50000.0000 [IU] | ORAL_CAPSULE | ORAL | 0 refills | Status: AC

## 2023-09-12 NOTE — Patient Instructions (Addendum)
 Give [place] 2-3 business days to get the results of your labs back.   Keep the diet clean and stay active.  Please get me a copy of your advanced directive form at your convenience.   Do monthly self testicular checks in the shower. You are feeling for lumps/bumps that don't belong. If you feel anything like this, let me know!  Let us know if you need anything.

## 2023-09-12 NOTE — Progress Notes (Signed)
 Chief Complaint  Patient presents with   Annual Exam    Annual Exam    Well Male Reginald Dodson is here for a complete physical.   His last physical was >1 year ago.  Current diet: in general, a "healthy" diet.   Current exercise: walking, wt lifting Weight trend: stable Fatigue out of ordinary? No. Seat belt? Yes.   Advanced directive? Yes  Health maintenance Tetanus- Yes HIV- Yes Hep C- Yes  Past Medical History:  Diagnosis Date   GERD (gastroesophageal reflux disease)    History of pulmonary embolus (PE)    Thought to be related to travel     Past Surgical History:  Procedure Laterality Date   APPENDECTOMY      Medications  Current Outpatient Medications on File Prior to Visit  Medication Sig Dispense Refill   cholecalciferol (VITAMIN D3) 25 MCG (1000 UT) tablet Take 1,000 Units by mouth daily.     Omega-3 Fatty Acids (FISH OIL) 1000 MG CAPS Take by mouth.      Allergies No Known Allergies  Family History Family History  Problem Relation Age of Onset   Healthy Mother    Healthy Father    Cancer Neg Hx    Clotting disorder Neg Hx     Review of Systems: Constitutional: no fevers or chills Eye:  no recent significant change in vision Ear/Nose/Mouth/Throat:  Ears:  no hearing loss Nose/Mouth/Throat:  no complaints of nasal congestion, no sore throat Cardiovascular:  no chest pain Respiratory:  no shortness of breath Gastrointestinal:  no abdominal pain, no change in bowel habits GU:  Male: negative for dysuria Musculoskeletal/Extremities:  no pain of the joints Integumentary (Skin/Breast):  no abnormal skin lesions reported Neurologic:  no headaches Endocrine: No unexpected weight changes Hematologic/Lymphatic:  no night sweats  Exam BP 118/72 (BP Location: Left Arm, Patient Position: Sitting)   Pulse 79   Temp 98 F (36.7 C) (Oral)   Resp 16   Ht 5\' 5"  (1.651 m)   Wt 150 lb (68 kg)   SpO2 99%   BMI 24.96 kg/m  General:  well developed, well  nourished, in no apparent distress Skin:  no significant moles, warts, or growths Head:  no masses, lesions, or tenderness Eyes:  pupils equal and round, sclera anicteric without injection Ears:  canals without lesions, TMs shiny without retraction, no obvious effusion, no erythema Nose:  nares patent, mucosa normal Throat/Pharynx:  lips and gingiva without lesion; tongue and uvula midline; non-inflamed pharynx; no exudates or postnasal drainage Neck: neck supple without adenopathy, thyromegaly, or masses Lungs:  clear to auscultation, breath sounds equal bilaterally, no respiratory distress Cardio:  regular rate and rhythm, no bruits, no LE edema Abdomen:  abdomen soft, nontender; bowel sounds normal; no masses or organomegaly Genital (male): Deferred Rectal: Deferred Musculoskeletal:  symmetrical muscle groups noted without atrophy or deformity Extremities:  no clubbing, cyanosis, or edema, no deformities, no skin discoloration Neuro:  gait normal; deep tendon reflexes normal and symmetric Psych: well oriented with normal range of affect and appropriate judgment/insight  Assessment and Plan  Well adult exam - Plan: CBC, Comprehensive metabolic panel, Lipid panel  Vitamin D insufficiency - Plan: VITAMIN D 25 Hydroxy (Vit-D Deficiency, Fractures)  Prediabetes - Plan: Hemoglobin A1c   Well 38 y.o. male. Counseled on diet and exercise. Self testicular exams recommended at least monthly.  Advanced directive form requested today.  Other orders as above. Follow up in 1 year pending the above workup. The patient voiced  understanding and agreement to the plan.  Jilda Roche [place], DO 09/12/23 7:58 AM

## 2024-01-01 ENCOUNTER — Other Ambulatory Visit: Payer: Self-pay

## 2024-01-01 ENCOUNTER — Other Ambulatory Visit (HOSPITAL_COMMUNITY): Payer: Self-pay

## 2024-03-19 DIAGNOSIS — H5213 Myopia, bilateral: Secondary | ICD-10-CM | POA: Diagnosis not present

## 2024-03-19 DIAGNOSIS — H52221 Regular astigmatism, right eye: Secondary | ICD-10-CM | POA: Diagnosis not present
